# Patient Record
Sex: Male | Born: 1945 | Race: White | Hispanic: No | Marital: Married | Smoking: Never smoker
Health system: Southern US, Community
[De-identification: ages and names within clinical notes are randomized; demographics above are authoritative.]

## PROBLEM LIST (undated history)

## (undated) DIAGNOSIS — E7800 Pure hypercholesterolemia, unspecified: Secondary | ICD-10-CM

## (undated) DIAGNOSIS — N400 Enlarged prostate without lower urinary tract symptoms: Secondary | ICD-10-CM

## (undated) DIAGNOSIS — I10 Essential (primary) hypertension: Secondary | ICD-10-CM

## (undated) DIAGNOSIS — C61 Malignant neoplasm of prostate: Secondary | ICD-10-CM

## (undated) HISTORY — DX: Essential (primary) hypertension: I10

## (undated) HISTORY — DX: Benign prostatic hyperplasia without lower urinary tract symptoms: N40.0

## (undated) HISTORY — DX: Pure hypercholesterolemia, unspecified: E78.00

## (undated) HISTORY — PX: TONSILLECTOMY: SUR1361

---

## 2009-08-13 ENCOUNTER — Ambulatory Visit: Payer: Self-pay | Admitting: Radiology

## 2009-08-13 ENCOUNTER — Emergency Department (HOSPITAL_BASED_OUTPATIENT_CLINIC_OR_DEPARTMENT_OTHER): Admission: EM | Admit: 2009-08-13 | Discharge: 2009-08-13 | Payer: Self-pay | Admitting: Emergency Medicine

## 2011-06-10 DIAGNOSIS — H31009 Unspecified chorioretinal scars, unspecified eye: Secondary | ICD-10-CM | POA: Diagnosis not present

## 2012-03-18 DIAGNOSIS — Z1331 Encounter for screening for depression: Secondary | ICD-10-CM | POA: Diagnosis not present

## 2012-03-18 DIAGNOSIS — L723 Sebaceous cyst: Secondary | ICD-10-CM | POA: Diagnosis not present

## 2012-04-02 DIAGNOSIS — I10 Essential (primary) hypertension: Secondary | ICD-10-CM | POA: Diagnosis not present

## 2012-04-02 DIAGNOSIS — Z0000 Encounter for general adult medical examination without abnormal findings: Secondary | ICD-10-CM | POA: Diagnosis not present

## 2012-04-02 DIAGNOSIS — E7800 Pure hypercholesterolemia, unspecified: Secondary | ICD-10-CM | POA: Diagnosis not present

## 2012-06-24 DIAGNOSIS — M19049 Primary osteoarthritis, unspecified hand: Secondary | ICD-10-CM | POA: Diagnosis not present

## 2012-06-30 DIAGNOSIS — D2110 Benign neoplasm of connective and other soft tissue of unspecified upper limb, including shoulder: Secondary | ICD-10-CM | POA: Diagnosis not present

## 2012-06-30 DIAGNOSIS — D481 Neoplasm of uncertain behavior of connective and other soft tissue: Secondary | ICD-10-CM | POA: Diagnosis not present

## 2012-06-30 DIAGNOSIS — D1610 Benign neoplasm of short bones of unspecified upper limb: Secondary | ICD-10-CM | POA: Diagnosis not present

## 2013-01-28 DIAGNOSIS — H31099 Other chorioretinal scars, unspecified eye: Secondary | ICD-10-CM | POA: Diagnosis not present

## 2013-01-28 DIAGNOSIS — H43819 Vitreous degeneration, unspecified eye: Secondary | ICD-10-CM | POA: Diagnosis not present

## 2013-01-28 DIAGNOSIS — Z9889 Other specified postprocedural states: Secondary | ICD-10-CM | POA: Diagnosis not present

## 2013-02-16 DIAGNOSIS — Z23 Encounter for immunization: Secondary | ICD-10-CM | POA: Diagnosis not present

## 2013-04-22 DIAGNOSIS — R7309 Other abnormal glucose: Secondary | ICD-10-CM | POA: Diagnosis not present

## 2013-04-22 DIAGNOSIS — Z0000 Encounter for general adult medical examination without abnormal findings: Secondary | ICD-10-CM | POA: Diagnosis not present

## 2013-04-22 DIAGNOSIS — E7800 Pure hypercholesterolemia, unspecified: Secondary | ICD-10-CM | POA: Diagnosis not present

## 2013-04-22 DIAGNOSIS — I10 Essential (primary) hypertension: Secondary | ICD-10-CM | POA: Diagnosis not present

## 2013-04-22 DIAGNOSIS — Z1211 Encounter for screening for malignant neoplasm of colon: Secondary | ICD-10-CM | POA: Diagnosis not present

## 2013-07-04 DIAGNOSIS — Z1211 Encounter for screening for malignant neoplasm of colon: Secondary | ICD-10-CM | POA: Diagnosis not present

## 2013-07-18 ENCOUNTER — Emergency Department (HOSPITAL_BASED_OUTPATIENT_CLINIC_OR_DEPARTMENT_OTHER)
Admission: EM | Admit: 2013-07-18 | Discharge: 2013-07-18 | Disposition: A | Discharge status: Home / Self Care | Payer: Medicare Other | Attending: Emergency Medicine | Admitting: Emergency Medicine

## 2013-07-18 ENCOUNTER — Encounter (HOSPITAL_BASED_OUTPATIENT_CLINIC_OR_DEPARTMENT_OTHER): Payer: Self-pay | Admitting: Emergency Medicine

## 2013-07-18 ENCOUNTER — Emergency Department (HOSPITAL_BASED_OUTPATIENT_CLINIC_OR_DEPARTMENT_OTHER): Payer: Medicare Other

## 2013-07-18 DIAGNOSIS — Y9389 Activity, other specified: Secondary | ICD-10-CM | POA: Insufficient documentation

## 2013-07-18 DIAGNOSIS — Z23 Encounter for immunization: Secondary | ICD-10-CM | POA: Diagnosis not present

## 2013-07-18 DIAGNOSIS — S6990XA Unspecified injury of unspecified wrist, hand and finger(s), initial encounter: Secondary | ICD-10-CM | POA: Diagnosis not present

## 2013-07-18 DIAGNOSIS — Y929 Unspecified place or not applicable: Secondary | ICD-10-CM | POA: Insufficient documentation

## 2013-07-18 DIAGNOSIS — M79609 Pain in unspecified limb: Secondary | ICD-10-CM | POA: Diagnosis not present

## 2013-07-18 DIAGNOSIS — S6980XA Other specified injuries of unspecified wrist, hand and finger(s), initial encounter: Secondary | ICD-10-CM | POA: Diagnosis not present

## 2013-07-18 DIAGNOSIS — S61209A Unspecified open wound of unspecified finger without damage to nail, initial encounter: Secondary | ICD-10-CM | POA: Diagnosis not present

## 2013-07-18 DIAGNOSIS — W278XXA Contact with other nonpowered hand tool, initial encounter: Secondary | ICD-10-CM | POA: Insufficient documentation

## 2013-07-18 DIAGNOSIS — S61311A Laceration without foreign body of left index finger with damage to nail, initial encounter: Secondary | ICD-10-CM

## 2013-07-18 MED ORDER — CEPHALEXIN 500 MG PO CAPS
500.0000 mg | ORAL_CAPSULE | Freq: Four times a day (QID) | ORAL | Status: DC
Start: 2013-07-18 — End: 2018-08-13

## 2013-07-18 MED ORDER — TETANUS-DIPHTH-ACELL PERTUSSIS 5-2.5-18.5 LF-MCG/0.5 IM SUSP
0.5000 mL | Freq: Once | INTRAMUSCULAR | Status: AC
  Administered 2013-07-18: 0.5 mL via INTRAMUSCULAR
  Filled 2013-07-18: qty 0.5

## 2013-07-18 MED ORDER — HYDROCODONE-ACETAMINOPHEN 5-325 MG PO TABS: 2.0000 | ORAL_TABLET | ORAL | Status: DC | PRN

## 2013-07-18 NOTE — ED Notes (Signed)
Pt reports that he got his finger caught in a table saw.  Noted to have multiple cuts on the tip of his (L) pointer finger.  Noted to be bleeding at this time.  Pressure dressing applied.

## 2013-07-18 NOTE — Discharge Instructions (Signed)
Keflex as prescribed.  Keep wound clean and dry. Change the dressing twice daily and apply bacitracin.  Return to the emergency department if you develop pus draining from the wound, streaks up the arm, or other new or concerning symptoms.   Fingertip Injuries and Amputations Fingertip injuries are common and often get injured because they are last to escape when pulling your hand out of harm's way. You have amputated (cut off) part of your finger. How this turns out depends largely on how much was amputated. If just the tip is amputated, often the end of the finger will grow back and the finger may return to much the same as it was before the injury.  If more of the finger is missing, your caregiver has done the best with the tissue remaining to allow you to keep as much finger as is possible. Your caregiver after checking your injury has tried to leave you with a painless fingertip that has durable, feeling skin. If possible, your caregiver has tried to maintain the finger's length and appearance and preserve its fingernail.  Please read the instructions outlined below and refer to this sheet in the next few weeks. These instructions provide you with general information on caring for yourself. Your caregiver may also give you specific instructions. While your treatment has been done according to the most current medical practices available, unavoidable complications occasionally occur. If you have any problems or questions after discharge, please call your caregiver. HOME CARE INSTRUCTIONS   You may resume normal diet and activities as directed or allowed.  Keep your hand elevated above the level of your heart. This helps decrease pain and swelling.  Keep ice packs (or a bag of ice wrapped in a towel) on the injured area for 15-20 minutes, 03-04 times per day, for the first two days.  Change dressings if necessary or as directed.  Clean the wound daily or as directed.  Only take  over-the-counter or prescription medicines for pain, discomfort, or fever as directed by your caregiver.  Keep appointments as directed. SEEK IMMEDIATE MEDICAL CARE IF:  You develop redness, swelling, numbness or increasing pain in the wound.  There is pus coming from the wound.  You develop an unexplained oral temperature above 102 F (38.9 C) or as your caregiver suggests.  There is a foul (bad) smell coming from the wound or dressing.  There is a breaking open of the wound (edges not staying together) after sutures or staples have been removed. MAKE SURE YOU:   Understand these instructions.  Will watch your condition.  Will get help right away if you are not doing well or get worse. Document Released: 04/09/2005 Document Revised: 08/11/2011 Document Reviewed: 03/08/2008 [facility] Patient Information 2014 [place], [place].

## 2013-07-18 NOTE — ED Provider Notes (Signed)
CSN: [id number]     Arrival date & time 07/18/13  1301 History   First MD Initiated Contact with Patient 07/18/13 1342     Chief Complaint  Patient presents with  . Finger Injury     (Consider location/radiation/quality/duration/timing/severity/associated sxs/prior Treatment) HPI Comments: Patient was operating a table saw when he caught his left finger tip in the blade causing a laceration. His last tetanus shot was 6 years ago.  Patient is a 68 y.o. male presenting with hand pain. The history is provided by the patient.  Hand Pain This is a new problem. The current episode started 1 to 2 hours ago. The problem occurs constantly. The problem has not changed since onset.Nothing aggravates the symptoms. Nothing relieves the symptoms. He has tried nothing for the symptoms. The treatment provided no relief.    History reviewed. No pertinent past medical history. History reviewed. No pertinent past surgical history. History reviewed. No pertinent family history. History  Substance Use Topics  . Smoking status: Never Smoker   . Smokeless tobacco: Not on file  . Alcohol Use: No    Review of Systems  All other systems reviewed and are negative.      Allergies  Review of patient's allergies indicates no known allergies.  Home Medications  No current outpatient prescriptions on file. BP 138/77  Pulse 93  Temp(Src) 98.9 F (37.2 C) (Oral)  Resp 18  Ht 6' (1.829 m)  Wt 212 lb (96.163 kg)  BMI 28.75 kg/m2  SpO2 98% Physical Exam  Nursing note and vitals reviewed. Constitutional: He is oriented to person, place, and time. He appears well-developed and well-nourished. No distress.  HENT:  Head: Normocephalic and atraumatic.  Neck: Neck supple.  Musculoskeletal:  The left index finger is noted to have a laceration to the tip extending approximately 1.5 cm. There is section of the nail which has been avulsed.  Neurological: He is alert and oriented to person, place, and time.   Skin: Skin is warm and dry. He is not diaphoretic.    ED Course  Procedures (including critical care time) Labs Review Labs Reviewed - No data to display Imaging Review Dg Finger Index Left  07/18/2013   CLINICAL DATA:  Traumatic injury with pain  EXAM: LEFT INDEX FINGER 2+V  COMPARISON:  None.  FINDINGS: Degenerative changes are noted in the interphalangeal joints. Very mild lucency is noted in the phalangeal tuft although no separated fracture is seen. Given the patient's clinical history this may represent a partial injury without complete separation of the bony fragment. Overlying soft tissue injury is noted.  IMPRESSION: Likely mild injury to the distal phalangeal tuft duct without definitive fracture.   Electronically Signed   By: Inez Catalina M.D.   On: 07/18/2013 13:54   LACERATION REPAIR Performed by: Veryl Speak Authorized by: Veryl Speak Consent: Verbal consent obtained. Risks and benefits: risks, benefits and alternatives were discussed Consent given by: patient Patient identity confirmed: provided demographic data Prepped and Draped in normal sterile fashion Wound explored  Laceration Location: Left index finger  Laceration Length: 1.5 cm cm  No Foreign Bodies seen or palpated  Anesthesia: local infiltration  Local anesthetic: lidocaine 2 % without epinephrine  Anesthetic total: 1 ml  Irrigation method: syringe Amount of cleaning: standard  Skin closure: 5-0 Ethilon   Number of sutures: 4   Technique: Simple interrupted   Patient tolerance: Patient tolerated the procedure well with no immediate complications.    MDM   Final diagnoses:  None  x-rays do not reveal a definite fracture. The laceration was repaired the patient tolerated this well. He will be given a tetanus shot and will be treated with antibiotics. He is to return as needed for any problems.    Veryl Speak, MD 07/18/13 1420

## 2013-07-27 DIAGNOSIS — Z4802 Encounter for removal of sutures: Secondary | ICD-10-CM | POA: Diagnosis not present

## 2013-07-27 DIAGNOSIS — S61209A Unspecified open wound of unspecified finger without damage to nail, initial encounter: Secondary | ICD-10-CM | POA: Diagnosis not present

## 2014-02-14 DIAGNOSIS — Z23 Encounter for immunization: Secondary | ICD-10-CM | POA: Diagnosis not present

## 2014-04-26 DIAGNOSIS — I10 Essential (primary) hypertension: Secondary | ICD-10-CM | POA: Diagnosis not present

## 2014-04-26 DIAGNOSIS — Z125 Encounter for screening for malignant neoplasm of prostate: Secondary | ICD-10-CM | POA: Diagnosis not present

## 2014-04-26 DIAGNOSIS — Z0000 Encounter for general adult medical examination without abnormal findings: Secondary | ICD-10-CM | POA: Diagnosis not present

## 2014-04-26 DIAGNOSIS — R7309 Other abnormal glucose: Secondary | ICD-10-CM | POA: Diagnosis not present

## 2014-04-26 DIAGNOSIS — E780 Pure hypercholesterolemia: Secondary | ICD-10-CM | POA: Diagnosis not present

## 2014-04-26 DIAGNOSIS — Z23 Encounter for immunization: Secondary | ICD-10-CM | POA: Diagnosis not present

## 2014-05-02 DIAGNOSIS — Z1211 Encounter for screening for malignant neoplasm of colon: Secondary | ICD-10-CM | POA: Diagnosis not present

## 2014-06-19 DIAGNOSIS — R312 Other microscopic hematuria: Secondary | ICD-10-CM | POA: Diagnosis not present

## 2014-06-19 DIAGNOSIS — R972 Elevated prostate specific antigen [PSA]: Secondary | ICD-10-CM | POA: Diagnosis not present

## 2014-08-17 DIAGNOSIS — R972 Elevated prostate specific antigen [PSA]: Secondary | ICD-10-CM | POA: Diagnosis not present

## 2014-09-04 DIAGNOSIS — R972 Elevated prostate specific antigen [PSA]: Secondary | ICD-10-CM | POA: Diagnosis not present

## 2014-09-04 DIAGNOSIS — R312 Other microscopic hematuria: Secondary | ICD-10-CM | POA: Diagnosis not present

## 2014-10-31 DIAGNOSIS — K8020 Calculus of gallbladder without cholecystitis without obstruction: Secondary | ICD-10-CM | POA: Diagnosis not present

## 2014-10-31 DIAGNOSIS — N200 Calculus of kidney: Secondary | ICD-10-CM | POA: Diagnosis not present

## 2014-10-31 DIAGNOSIS — R312 Other microscopic hematuria: Secondary | ICD-10-CM | POA: Diagnosis not present

## 2014-10-31 DIAGNOSIS — K5730 Diverticulosis of large intestine without perforation or abscess without bleeding: Secondary | ICD-10-CM | POA: Diagnosis not present

## 2014-11-07 DIAGNOSIS — N400 Enlarged prostate without lower urinary tract symptoms: Secondary | ICD-10-CM | POA: Diagnosis not present

## 2014-11-07 DIAGNOSIS — R972 Elevated prostate specific antigen [PSA]: Secondary | ICD-10-CM | POA: Diagnosis not present

## 2014-11-07 DIAGNOSIS — R312 Other microscopic hematuria: Secondary | ICD-10-CM | POA: Diagnosis not present

## 2014-12-13 DIAGNOSIS — Z9889 Other specified postprocedural states: Secondary | ICD-10-CM | POA: Diagnosis not present

## 2014-12-13 DIAGNOSIS — H43813 Vitreous degeneration, bilateral: Secondary | ICD-10-CM | POA: Diagnosis not present

## 2014-12-13 DIAGNOSIS — H31093 Other chorioretinal scars, bilateral: Secondary | ICD-10-CM | POA: Diagnosis not present

## 2014-12-13 DIAGNOSIS — H43312 Vitreous membranes and strands, left eye: Secondary | ICD-10-CM | POA: Diagnosis not present

## 2015-02-01 IMAGING — CR DG FINGER INDEX 2+V*L*
3 series · 3 of 3 positions shown · non-contrast
Comparison: None.

CLINICAL DATA: Traumatic injury with pain

EXAM:
LEFT INDEX FINGER 2+V

[x finger pa left]
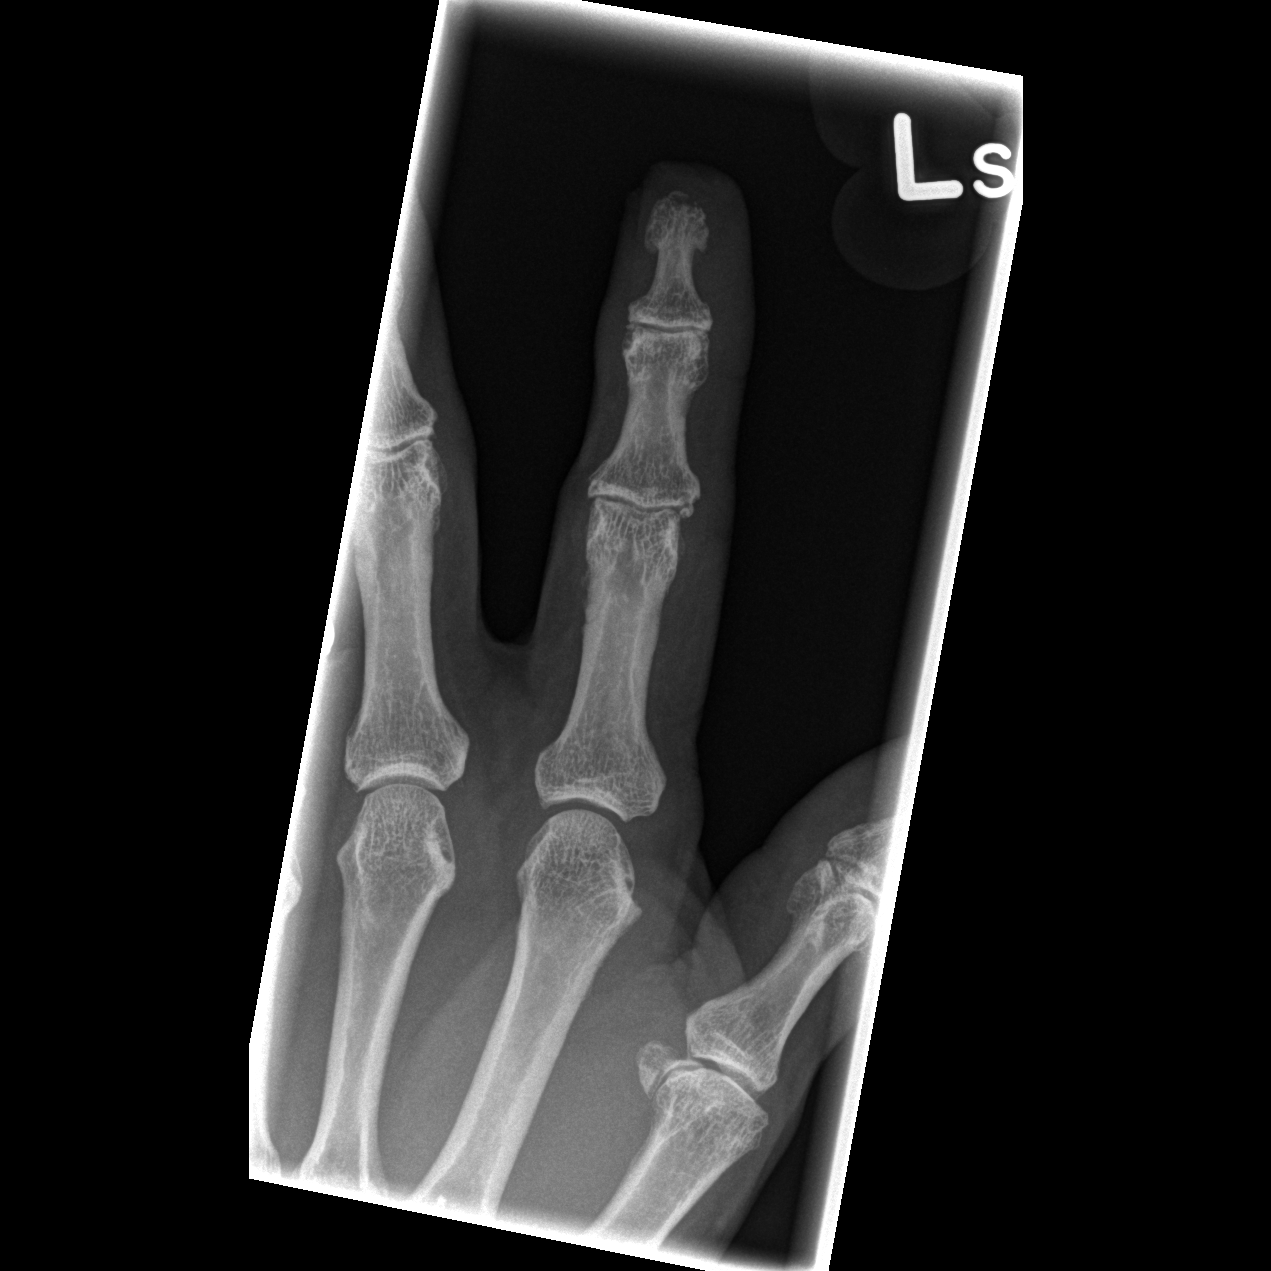

[x finger obl. left]
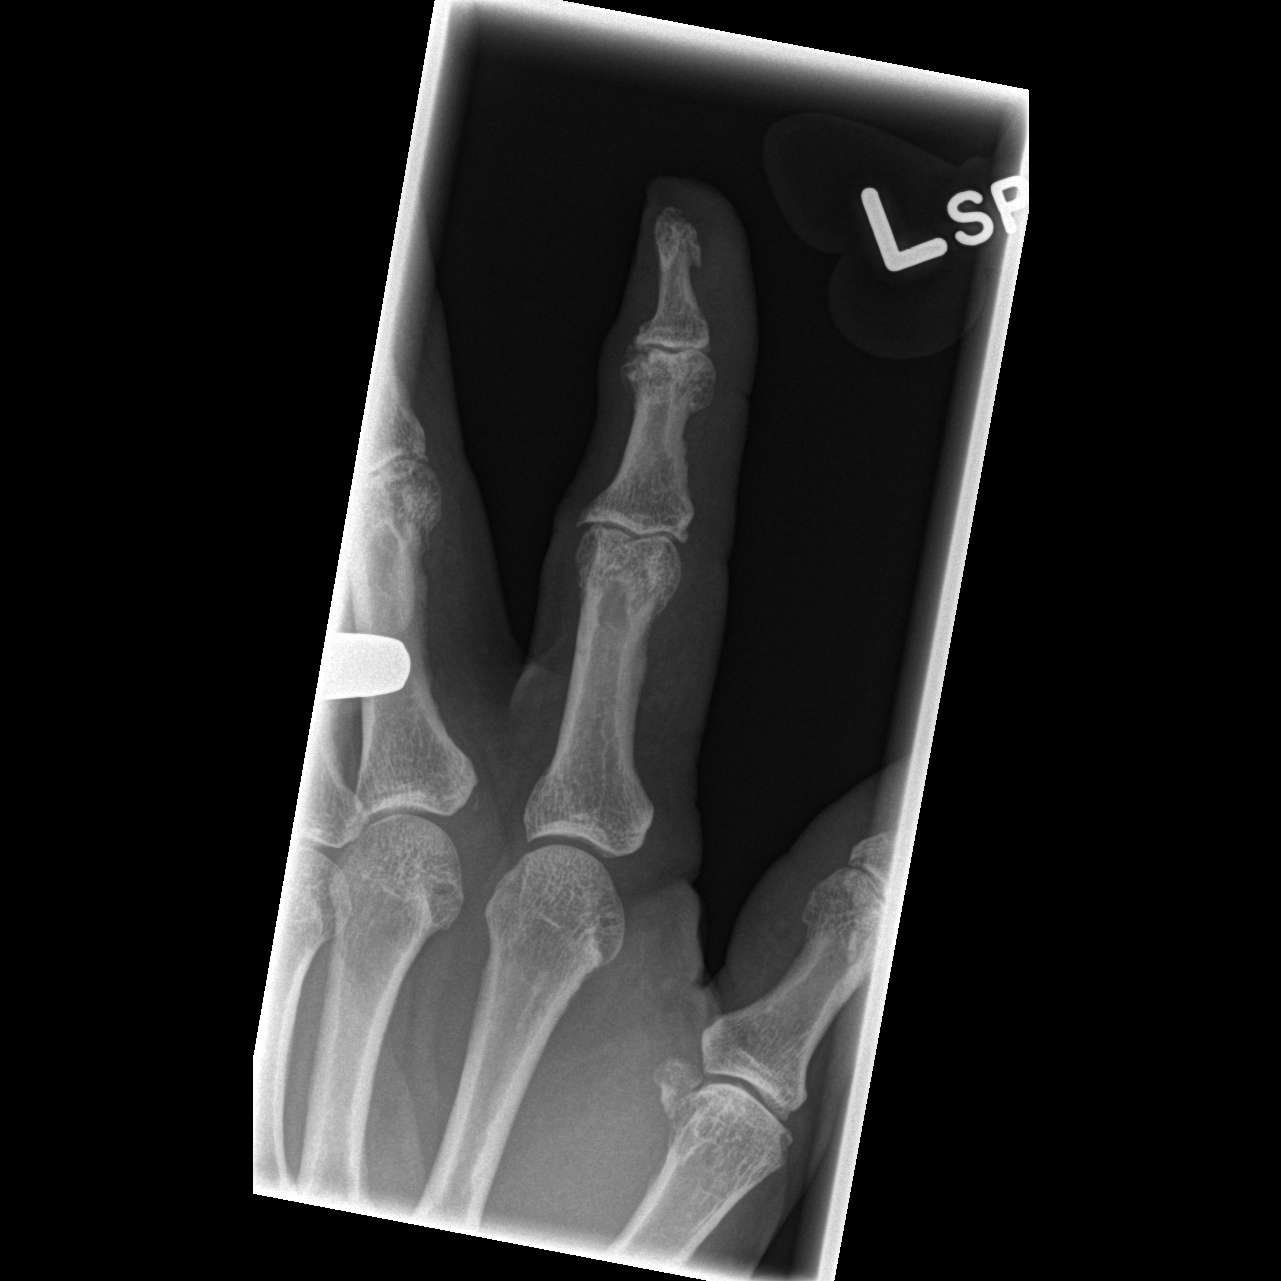

[x finger lateral left]
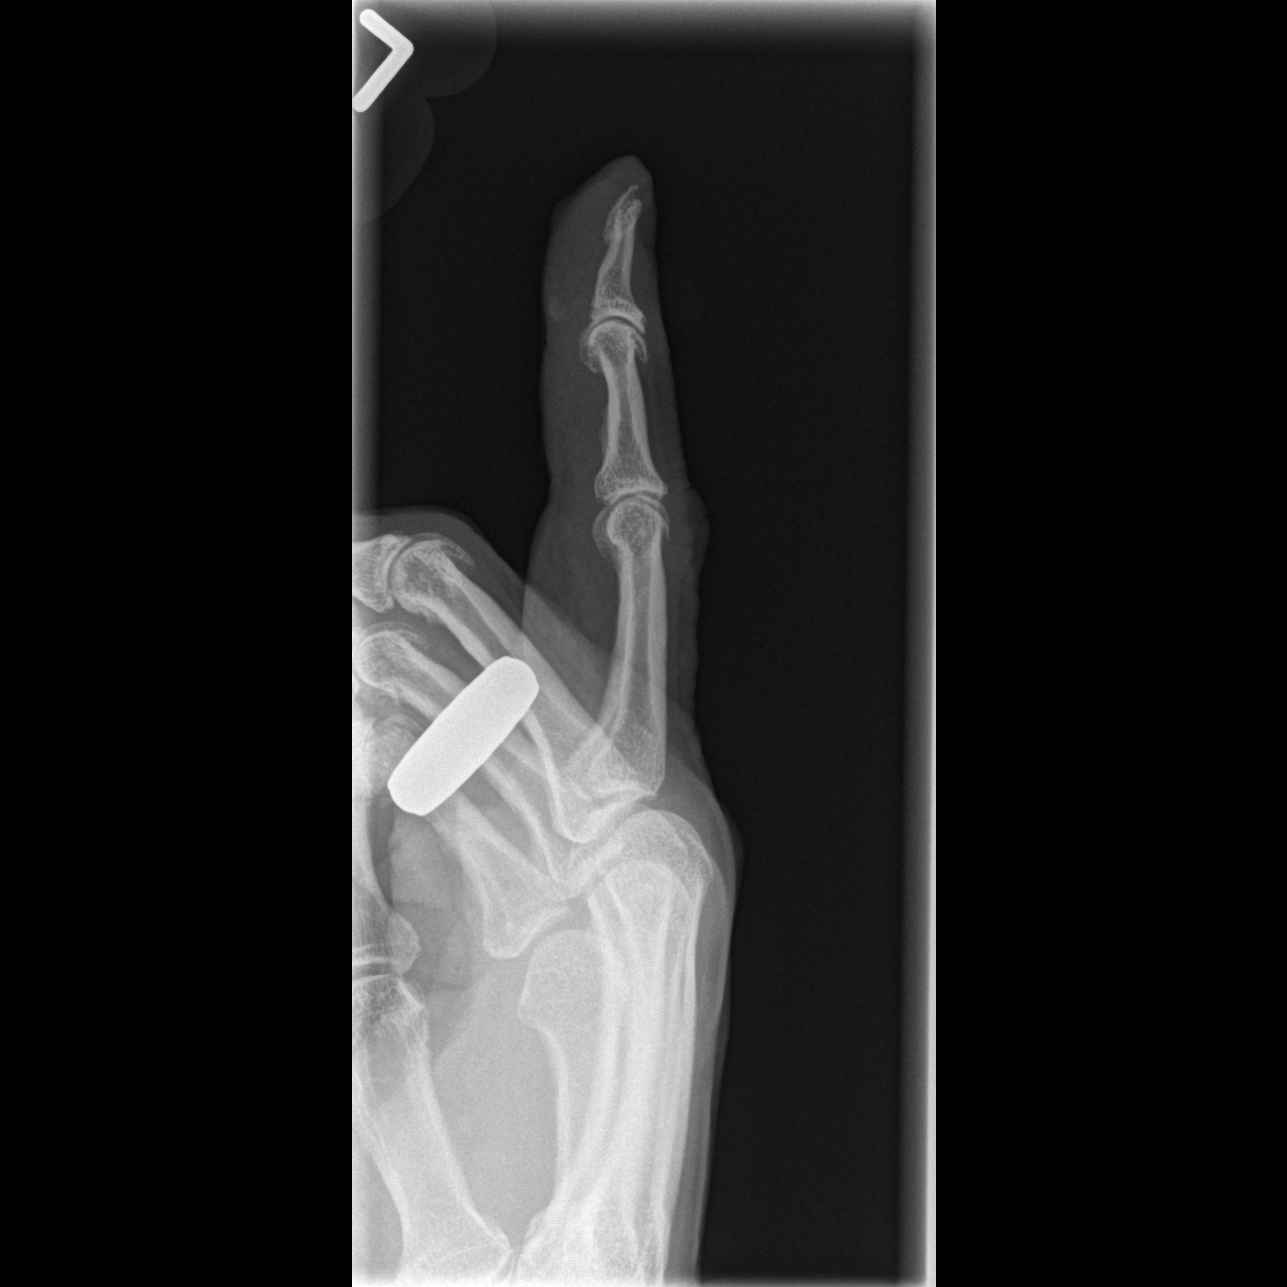

[3 of 3 positions shown; findings below may reference images not displayed]

FINDINGS: Degenerative changes are noted in the interphalangeal joints. Very
mild lucency is noted in the phalangeal tuft although no separated
fracture is seen. Given the patient's clinical history this may
represent a partial injury without complete separation of the bony
fragment. Overlying soft tissue injury is noted.
IMPRESSION: Likely mild injury to the distal phalangeal tuft duct without
definitive fracture.

## 2015-03-06 DIAGNOSIS — Z23 Encounter for immunization: Secondary | ICD-10-CM | POA: Diagnosis not present

## 2015-05-23 DIAGNOSIS — M109 Gout, unspecified: Secondary | ICD-10-CM | POA: Diagnosis not present

## 2015-06-06 DIAGNOSIS — Z0000 Encounter for general adult medical examination without abnormal findings: Secondary | ICD-10-CM | POA: Diagnosis not present

## 2015-06-06 DIAGNOSIS — R972 Elevated prostate specific antigen [PSA]: Secondary | ICD-10-CM | POA: Diagnosis not present

## 2015-06-06 DIAGNOSIS — E7800 Pure hypercholesterolemia, unspecified: Secondary | ICD-10-CM | POA: Diagnosis not present

## 2015-06-06 DIAGNOSIS — I10 Essential (primary) hypertension: Secondary | ICD-10-CM | POA: Diagnosis not present

## 2015-06-06 DIAGNOSIS — R7309 Other abnormal glucose: Secondary | ICD-10-CM | POA: Diagnosis not present

## 2015-06-06 DIAGNOSIS — M25531 Pain in right wrist: Secondary | ICD-10-CM | POA: Diagnosis not present

## 2015-06-06 DIAGNOSIS — Z1389 Encounter for screening for other disorder: Secondary | ICD-10-CM | POA: Diagnosis not present

## 2015-06-29 DIAGNOSIS — R7301 Impaired fasting glucose: Secondary | ICD-10-CM | POA: Diagnosis not present

## 2015-09-12 DIAGNOSIS — E7800 Pure hypercholesterolemia, unspecified: Secondary | ICD-10-CM | POA: Diagnosis not present

## 2015-09-12 DIAGNOSIS — I10 Essential (primary) hypertension: Secondary | ICD-10-CM | POA: Diagnosis not present

## 2015-11-01 DIAGNOSIS — N400 Enlarged prostate without lower urinary tract symptoms: Secondary | ICD-10-CM | POA: Diagnosis not present

## 2015-11-08 DIAGNOSIS — R972 Elevated prostate specific antigen [PSA]: Secondary | ICD-10-CM | POA: Diagnosis not present

## 2015-11-08 DIAGNOSIS — R3121 Asymptomatic microscopic hematuria: Secondary | ICD-10-CM | POA: Diagnosis not present

## 2015-12-19 DIAGNOSIS — Z1211 Encounter for screening for malignant neoplasm of colon: Secondary | ICD-10-CM | POA: Diagnosis not present

## 2015-12-25 DIAGNOSIS — H35373 Puckering of macula, bilateral: Secondary | ICD-10-CM | POA: Diagnosis not present

## 2015-12-25 DIAGNOSIS — H43813 Vitreous degeneration, bilateral: Secondary | ICD-10-CM | POA: Diagnosis not present

## 2015-12-25 DIAGNOSIS — Z9889 Other specified postprocedural states: Secondary | ICD-10-CM | POA: Diagnosis not present

## 2015-12-25 DIAGNOSIS — H52213 Irregular astigmatism, bilateral: Secondary | ICD-10-CM | POA: Diagnosis not present

## 2016-02-14 DIAGNOSIS — Z23 Encounter for immunization: Secondary | ICD-10-CM | POA: Diagnosis not present

## 2016-06-20 DIAGNOSIS — Z0000 Encounter for general adult medical examination without abnormal findings: Secondary | ICD-10-CM | POA: Diagnosis not present

## 2016-06-20 DIAGNOSIS — I10 Essential (primary) hypertension: Secondary | ICD-10-CM | POA: Diagnosis not present

## 2016-06-20 DIAGNOSIS — Z1211 Encounter for screening for malignant neoplasm of colon: Secondary | ICD-10-CM | POA: Diagnosis not present

## 2016-06-20 DIAGNOSIS — E7800 Pure hypercholesterolemia, unspecified: Secondary | ICD-10-CM | POA: Diagnosis not present

## 2016-07-11 DIAGNOSIS — J101 Influenza due to other identified influenza virus with other respiratory manifestations: Secondary | ICD-10-CM | POA: Diagnosis not present

## 2016-07-11 DIAGNOSIS — R6889 Other general symptoms and signs: Secondary | ICD-10-CM | POA: Diagnosis not present

## 2016-11-14 DIAGNOSIS — M545 Low back pain: Secondary | ICD-10-CM | POA: Diagnosis not present

## 2016-11-19 DIAGNOSIS — R972 Elevated prostate specific antigen [PSA]: Secondary | ICD-10-CM | POA: Diagnosis not present

## 2016-11-24 DIAGNOSIS — R3121 Asymptomatic microscopic hematuria: Secondary | ICD-10-CM | POA: Diagnosis not present

## 2016-11-24 DIAGNOSIS — N400 Enlarged prostate without lower urinary tract symptoms: Secondary | ICD-10-CM | POA: Diagnosis not present

## 2016-11-24 DIAGNOSIS — R972 Elevated prostate specific antigen [PSA]: Secondary | ICD-10-CM | POA: Diagnosis not present

## 2016-11-27 ENCOUNTER — Other Ambulatory Visit: Payer: Self-pay | Admitting: Urology

## 2016-11-27 DIAGNOSIS — R972 Elevated prostate specific antigen [PSA]: Secondary | ICD-10-CM

## 2016-12-01 DIAGNOSIS — M545 Low back pain: Secondary | ICD-10-CM | POA: Diagnosis not present

## 2017-01-06 ENCOUNTER — Ambulatory Visit
Admission: RE | Admit: 2017-01-06 | Discharge: 2017-01-06 | Disposition: A | Discharge status: Home / Self Care | Payer: Medicare Other | Source: Ambulatory Visit | Attending: Urology | Admitting: Urology

## 2017-01-06 DIAGNOSIS — R972 Elevated prostate specific antigen [PSA]: Secondary | ICD-10-CM | POA: Diagnosis not present

## 2017-01-06 MED ORDER — GADOBENATE DIMEGLUMINE 529 MG/ML IV SOLN
20.0000 mL | Freq: Once | INTRAVENOUS | Status: AC | PRN
  Administered 2017-01-06: 20 mL via INTRAVENOUS

## 2017-01-13 DIAGNOSIS — H35371 Puckering of macula, right eye: Secondary | ICD-10-CM | POA: Diagnosis not present

## 2017-01-13 DIAGNOSIS — H40013 Open angle with borderline findings, low risk, bilateral: Secondary | ICD-10-CM | POA: Diagnosis not present

## 2017-01-13 DIAGNOSIS — H47323 Drusen of optic disc, bilateral: Secondary | ICD-10-CM | POA: Diagnosis not present

## 2017-01-13 DIAGNOSIS — H2513 Age-related nuclear cataract, bilateral: Secondary | ICD-10-CM | POA: Diagnosis not present

## 2017-01-13 DIAGNOSIS — H43813 Vitreous degeneration, bilateral: Secondary | ICD-10-CM | POA: Diagnosis not present

## 2017-01-26 DIAGNOSIS — S32018A Other fracture of first lumbar vertebra, initial encounter for closed fracture: Secondary | ICD-10-CM | POA: Diagnosis not present

## 2017-01-26 DIAGNOSIS — N400 Enlarged prostate without lower urinary tract symptoms: Secondary | ICD-10-CM | POA: Diagnosis not present

## 2017-01-26 DIAGNOSIS — R972 Elevated prostate specific antigen [PSA]: Secondary | ICD-10-CM | POA: Diagnosis not present

## 2017-01-26 DIAGNOSIS — R3121 Asymptomatic microscopic hematuria: Secondary | ICD-10-CM | POA: Diagnosis not present

## 2017-03-01 DIAGNOSIS — Z23 Encounter for immunization: Secondary | ICD-10-CM | POA: Diagnosis not present

## 2017-05-06 DIAGNOSIS — M545 Low back pain: Secondary | ICD-10-CM | POA: Diagnosis not present

## 2017-06-24 DIAGNOSIS — Z79899 Other long term (current) drug therapy: Secondary | ICD-10-CM | POA: Diagnosis not present

## 2017-06-24 DIAGNOSIS — M545 Low back pain: Secondary | ICD-10-CM | POA: Diagnosis not present

## 2017-06-24 DIAGNOSIS — Z0000 Encounter for general adult medical examination without abnormal findings: Secondary | ICD-10-CM | POA: Diagnosis not present

## 2017-06-24 DIAGNOSIS — E7800 Pure hypercholesterolemia, unspecified: Secondary | ICD-10-CM | POA: Diagnosis not present

## 2017-06-24 DIAGNOSIS — Z1159 Encounter for screening for other viral diseases: Secondary | ICD-10-CM | POA: Diagnosis not present

## 2017-06-24 DIAGNOSIS — I700 Atherosclerosis of aorta: Secondary | ICD-10-CM | POA: Diagnosis not present

## 2017-06-24 DIAGNOSIS — I10 Essential (primary) hypertension: Secondary | ICD-10-CM | POA: Diagnosis not present

## 2017-07-02 DIAGNOSIS — Z8719 Personal history of other diseases of the digestive system: Secondary | ICD-10-CM | POA: Diagnosis not present

## 2017-07-02 DIAGNOSIS — R1032 Left lower quadrant pain: Secondary | ICD-10-CM | POA: Diagnosis not present

## 2017-07-08 DIAGNOSIS — M545 Low back pain, unspecified: Secondary | ICD-10-CM | POA: Insufficient documentation

## 2017-07-08 DIAGNOSIS — M5136 Other intervertebral disc degeneration, lumbar region: Secondary | ICD-10-CM | POA: Diagnosis not present

## 2017-07-08 DIAGNOSIS — M519 Unspecified thoracic, thoracolumbar and lumbosacral intervertebral disc disorder: Secondary | ICD-10-CM | POA: Diagnosis not present

## 2017-07-13 DIAGNOSIS — R413 Other amnesia: Secondary | ICD-10-CM | POA: Diagnosis not present

## 2017-11-23 DIAGNOSIS — R972 Elevated prostate specific antigen [PSA]: Secondary | ICD-10-CM | POA: Diagnosis not present

## 2017-11-30 DIAGNOSIS — R3121 Asymptomatic microscopic hematuria: Secondary | ICD-10-CM | POA: Diagnosis not present

## 2017-11-30 DIAGNOSIS — N400 Enlarged prostate without lower urinary tract symptoms: Secondary | ICD-10-CM | POA: Diagnosis not present

## 2017-11-30 DIAGNOSIS — R972 Elevated prostate specific antigen [PSA]: Secondary | ICD-10-CM | POA: Diagnosis not present

## 2018-01-14 DIAGNOSIS — R413 Other amnesia: Secondary | ICD-10-CM | POA: Diagnosis not present

## 2018-01-14 DIAGNOSIS — E7800 Pure hypercholesterolemia, unspecified: Secondary | ICD-10-CM | POA: Diagnosis not present

## 2018-01-14 DIAGNOSIS — I10 Essential (primary) hypertension: Secondary | ICD-10-CM | POA: Diagnosis not present

## 2018-02-20 DIAGNOSIS — Z23 Encounter for immunization: Secondary | ICD-10-CM | POA: Diagnosis not present

## 2018-05-12 DIAGNOSIS — K648 Other hemorrhoids: Secondary | ICD-10-CM | POA: Diagnosis not present

## 2018-05-12 DIAGNOSIS — K5730 Diverticulosis of large intestine without perforation or abscess without bleeding: Secondary | ICD-10-CM | POA: Diagnosis not present

## 2018-05-12 DIAGNOSIS — D122 Benign neoplasm of ascending colon: Secondary | ICD-10-CM | POA: Diagnosis not present

## 2018-05-12 DIAGNOSIS — Z1211 Encounter for screening for malignant neoplasm of colon: Secondary | ICD-10-CM | POA: Diagnosis not present

## 2018-05-14 DIAGNOSIS — D122 Benign neoplasm of ascending colon: Secondary | ICD-10-CM | POA: Diagnosis not present

## 2018-06-25 DIAGNOSIS — I10 Essential (primary) hypertension: Secondary | ICD-10-CM | POA: Diagnosis not present

## 2018-06-25 DIAGNOSIS — E7800 Pure hypercholesterolemia, unspecified: Secondary | ICD-10-CM | POA: Diagnosis not present

## 2018-06-25 DIAGNOSIS — Z0000 Encounter for general adult medical examination without abnormal findings: Secondary | ICD-10-CM | POA: Diagnosis not present

## 2018-06-25 DIAGNOSIS — R413 Other amnesia: Secondary | ICD-10-CM | POA: Diagnosis not present

## 2018-07-23 IMAGING — MR MR PROSTATE WO/W CM
56 series · 56 of 56 positions shown · IV contrast (Multihance 20cc)
Comparison: 10/31/2014 abdominopelvic CT.

CLINICAL DATA: Elevated PSA.  No symptoms per patient.

EXAM:
MR PROSTATE WITHOUT AND WITH CONTRAST
TECHNIQUE: Multiplanar multisequence MRI images were obtained of the pelvis
centered about the prostate. Pre and post contrast images were
obtained.
CONTRAST:  20mL MULTIHANCE GADOBENATE DIMEGLUMINE 529 MG/ML IV SOLN
Creatinine was obtained on site at [HOSPITAL] at [HOSPITAL].
Results: Creatinine 0.7 mg/dL.

[Series 3: T1 · axial · 8.0mm · 1.06mm/px · 1 of 28 slices shown (1 of 2)]
[im 1/28]
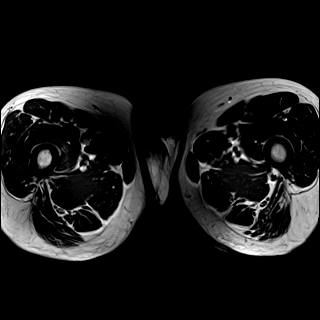

[Series 4: T2 · sagittal · 3.5mm · 0.56mm/px · 1 of 41 slices shown (1 of 4)]
[im 1/41]
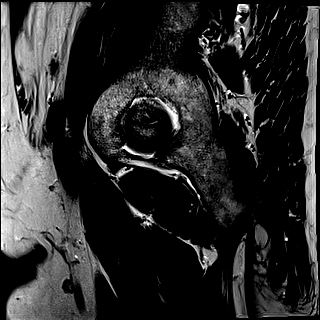

[Series 5: bSSFP fat-sat · axial · 8.0mm · 0.74mm/px · 1 of 28 slices shown]
[im 1/28]
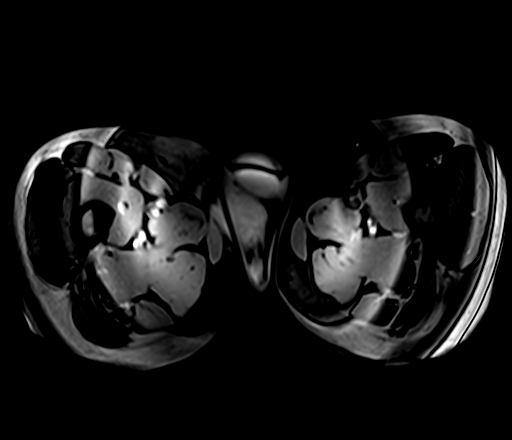

[Series 6: T1 · axial · 3.0mm · 0.31mm/px · 1 of 40 slices shown (2 of 2)]
[im 1/40]
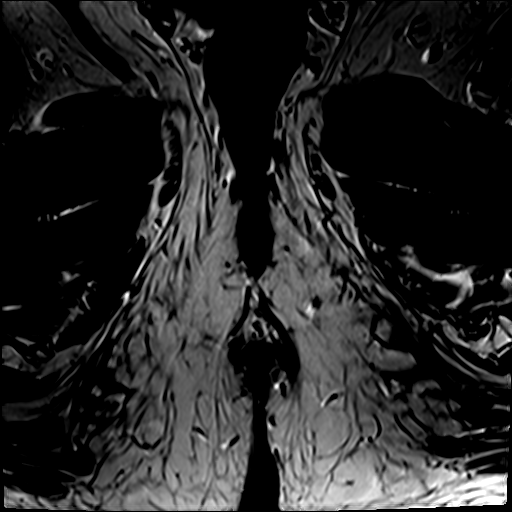

[Series 7: T2 · axial · 3.5mm · 0.56mm/px · 1 of 23 slices shown (2 of 4)]
[im 1/23]
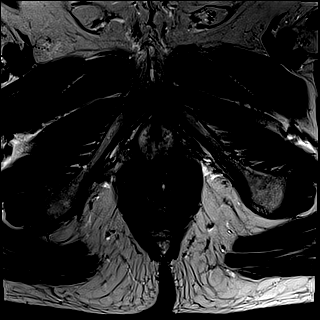

[Series 8: T2 · axial · 1.0mm · 1.04mm/px · 1 of 80 slices shown (3 of 4)]
[im 1/80]
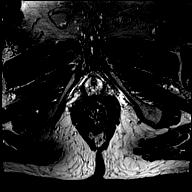

[Series 9: T2 · coronal · 3.5mm · 0.56mm/px · 1 of 34 slices shown (4 of 4)]
[im 1/34]
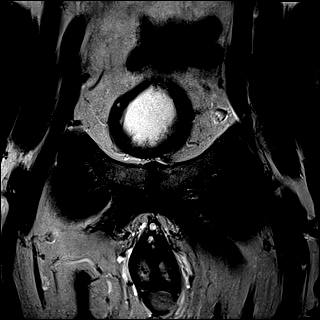

[Series 10: DWI · axial · 3.5mm · 1.56mm/px · 1 of 58 slices shown (1 of 2)]
[im 1/58]
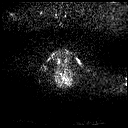

[Series 11: DWI · axial · 3.5mm · 1.56mm/px · 1 of 20 slices shown (2 of 2)]
[im 1/20]
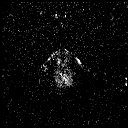

[Series 12: pre t1_twist_tra_dyn_ttc=6.4s · axial · non-contrast · 3.5mm · 0.83mm/px · 1 of 24 slices shown]
[im 1/24]
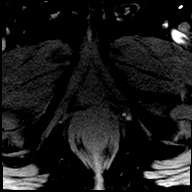

[Series 13: post t1_twist_tra_dyn-copy center · axial · 3.5mm · 0.83mm/px · 1 of 24 slices shown (1 of 24)]
[im 1/24]
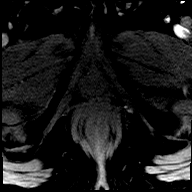

[Series 14: post t1_twist_tra_dyn-copy center · axial · 3.5mm · 0.83mm/px · 1 of 24 slices shown (2 of 24)]
[im 1/24]
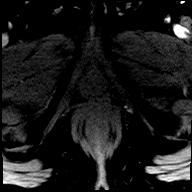

[Series 15: post t1_twist_tra_dyn-copy cent_sub_ttc=28.4s · axial · 3.5mm · 0.83mm/px · 1 of 24 slices shown]
[im 1/24]
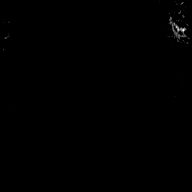

[Series 16: post t1_twist_tra_dyn-copy center · axial · 3.5mm · 0.83mm/px · 1 of 24 slices shown (3 of 24)]
[im 1/24]
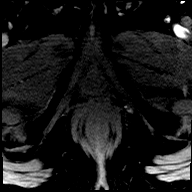

[Series 17: post t1_twist_tra_dyn-copy cent_sub_ttc=41.2s · axial · 3.5mm · 0.83mm/px · 1 of 24 slices shown]
[im 1/24]
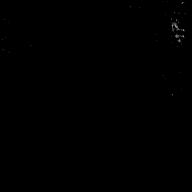

[Series 18: post t1_twist_tra_dyn-copy center · axial · 3.5mm · 0.83mm/px · 1 of 24 slices shown (4 of 24)]
[im 1/24]
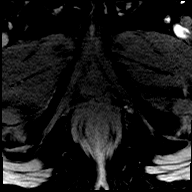

[Series 19: post t1_twist_tra_dyn-copy cent_sub_ttc=54.0s · axial · 3.5mm · 0.83mm/px · 1 of 24 slices shown]
[im 1/24]
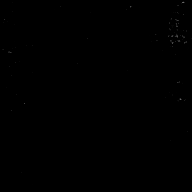

[Series 20: post t1_twist_tra_dyn-copy center · axial · 3.5mm · 0.83mm/px · 1 of 24 slices shown (5 of 24)]
[im 1/24]
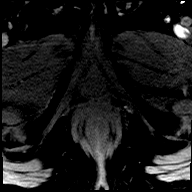

[Series 21: post t1_twist_tra_dyn-copy cent_sub_ttc=66.8s · axial · 3.5mm · 0.83mm/px · 1 of 24 slices shown]
[im 1/24]
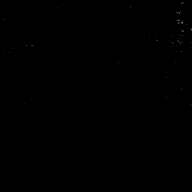

[Series 22: post t1_twist_tra_dyn-copy center · axial · 3.5mm · 0.83mm/px · 1 of 24 slices shown (6 of 24)]
[im 1/24]
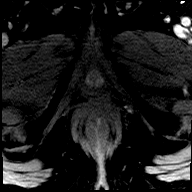

[Series 23: post t1_twist_tra_dyn-copy cent_sub_ttc=79.6s · axial · 3.5mm · 0.83mm/px · 1 of 24 slices shown]
[im 1/24]
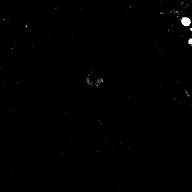

[Series 24: post t1_twist_tra_dyn-copy center · axial · 3.5mm · 0.83mm/px · 1 of 24 slices shown (7 of 24)]
[im 1/24]
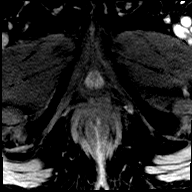

[Series 25: post t1_twist_tra_dyn-copy cent_sub_ttc=92.4s · axial · 3.5mm · 0.83mm/px · 1 of 24 slices shown]
[im 1/24]
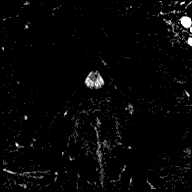

[Series 26: post t1_twist_tra_dyn-copy center · axial · 3.5mm · 0.83mm/px · 1 of 24 slices shown (8 of 24)]
[im 1/24]
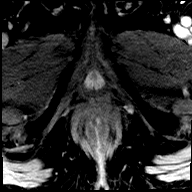

[Series 27: post t1_twist_tra_dyn-copy cent_sub_ttc=105.2s · axial · 3.5mm · 0.83mm/px · 1 of 23 slices shown]
[im 1/23]
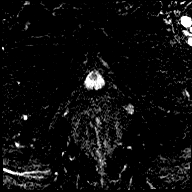

[Series 28: post t1_twist_tra_dyn-copy center · axial · 3.5mm · 0.83mm/px · 1 of 24 slices shown (9 of 24)]
[im 1/24]
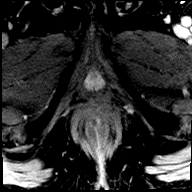

[Series 29: post t1_twist_tra_dyn-copy cent_sub_ttc=117.9s · axial · 3.5mm · 0.83mm/px · 1 of 24 slices shown]
[im 1/24]
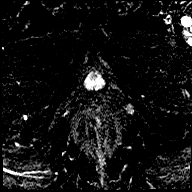

[Series 30: post t1_twist_tra_dyn-copy center · axial · 3.5mm · 0.83mm/px · 1 of 24 slices shown (10 of 24)]
[im 1/24]
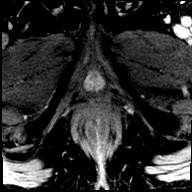

[Series 31: post t1_twist_tra_dyn-copy cent_sub_ttc=130.7s · axial · 3.5mm · 0.83mm/px · 1 of 24 slices shown]
[im 1/24]
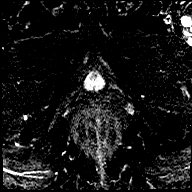

[Series 32: post t1_twist_tra_dyn-copy center · axial · 3.5mm · 0.83mm/px · 1 of 24 slices shown (11 of 24)]
[im 1/24]
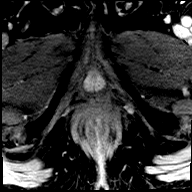

[Series 33: post t1_twist_tra_dyn-copy cent_sub_ttc=143.5s · axial · 3.5mm · 0.83mm/px · 1 of 24 slices shown]
[im 1/24]
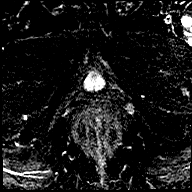

[Series 34: post t1_twist_tra_dyn-copy center · axial · 3.5mm · 0.83mm/px · 1 of 24 slices shown (12 of 24)]
[im 1/24]
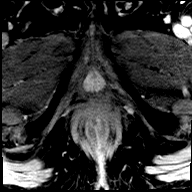

[Series 35: post t1_twist_tra_dyn-copy cent_sub_ttc=156.3s · axial · 3.5mm · 0.83mm/px · 1 of 24 slices shown]
[im 1/24]
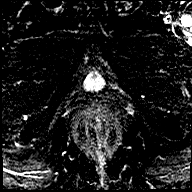

[Series 36: post t1_twist_tra_dyn-copy center · axial · 3.5mm · 0.83mm/px · 1 of 24 slices shown (13 of 24)]
[im 1/24]
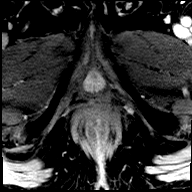

[Series 37: post t1_twist_tra_dyn-copy cent_sub_ttc=169.1s · axial · 3.5mm · 0.83mm/px · 1 of 24 slices shown]
[im 1/24]
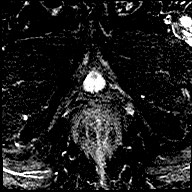

[Series 38: post t1_twist_tra_dyn-copy center · axial · 3.5mm · 0.83mm/px · 1 of 24 slices shown (14 of 24)]
[im 1/24]
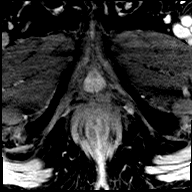

[Series 39: post t1_twist_tra_dyn-copy cent_sub_ttc=181.9s · axial · 3.5mm · 0.83mm/px · 1 of 24 slices shown]
[im 1/24]
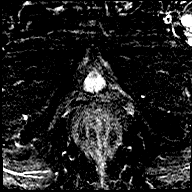

[Series 40: post t1_twist_tra_dyn-copy center · axial · 3.5mm · 0.83mm/px · 1 of 24 slices shown (15 of 24)]
[im 1/24]
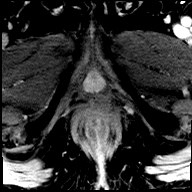

[Series 41: post t1_twist_tra_dyn-copy cent_sub_ttc=194.7s · axial · 3.5mm · 0.83mm/px · 1 of 24 slices shown]
[im 1/24]
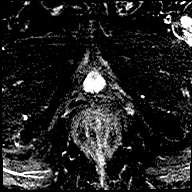

[Series 42: post t1_twist_tra_dyn-copy center · axial · 3.5mm · 0.83mm/px · 1 of 24 slices shown (16 of 24)]
[im 1/24]
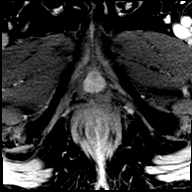

[Series 43: post t1_twist_tra_dyn-copy cent_sub_ttc=207.5s · axial · 3.5mm · 0.83mm/px · 1 of 24 slices shown]
[im 1/24]
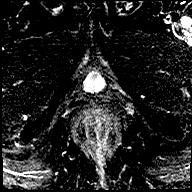

[Series 44: post t1_twist_tra_dyn-copy center · axial · 3.5mm · 0.83mm/px · 1 of 24 slices shown (17 of 24)]
[im 1/24]
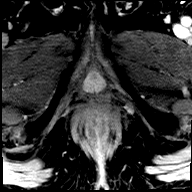

[Series 45: post t1_twist_tra_dyn-copy cent_sub_ttc=220.3s · axial · 3.5mm · 0.83mm/px · 1 of 24 slices shown]
[im 1/24]
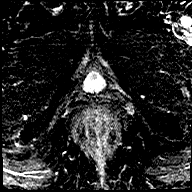

[Series 46: post t1_twist_tra_dyn-copy center · axial · 3.5mm · 0.83mm/px · 1 of 24 slices shown (18 of 24)]
[im 1/24]
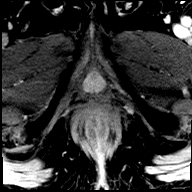

[Series 47: post t1_twist_tra_dyn-copy cent_sub_ttc=233.1s · axial · 3.5mm · 0.83mm/px · 1 of 24 slices shown]
[im 1/24]
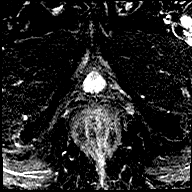

[Series 48: post t1_twist_tra_dyn-copy center · axial · 3.5mm · 0.83mm/px · 1 of 24 slices shown (19 of 24)]
[im 1/24]
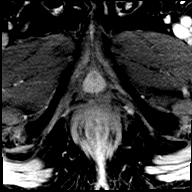

[Series 49: post t1_twist_tra_dyn-copy cent_sub_ttc=245.9s · axial · 3.5mm · 0.83mm/px · 1 of 24 slices shown]
[im 1/24]
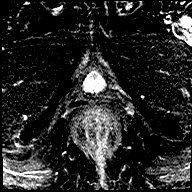

[Series 50: post t1_twist_tra_dyn-copy center · axial · 3.5mm · 0.83mm/px · 1 of 24 slices shown (20 of 24)]
[im 1/24]
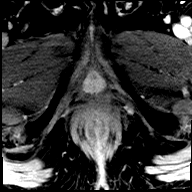

[Series 51: post t1_twist_tra_dyn-copy cent_sub_ttc=258.6s · axial · 3.5mm · 0.83mm/px · 1 of 24 slices shown]
[im 1/24]
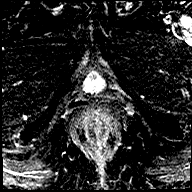

[Series 52: post t1_twist_tra_dyn-copy center · axial · 3.5mm · 0.83mm/px · 1 of 24 slices shown (21 of 24)]
[im 1/24]
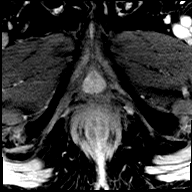

[Series 53: post t1_twist_tra_dyn-copy cent_sub_ttc=271.4s · axial · 3.5mm · 0.83mm/px · 1 of 24 slices shown]
[im 1/24]
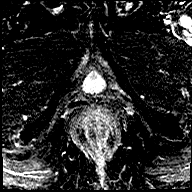

[Series 54: post t1_twist_tra_dyn-copy center · axial · 3.5mm · 0.83mm/px · 1 of 24 slices shown (22 of 24)]
[im 1/24]
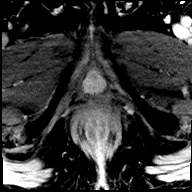

[Series 55: post t1_twist_tra_dyn-copy cent_sub_ttc=284.2s · axial · 3.5mm · 0.83mm/px · 1 of 24 slices shown]
[im 1/24]
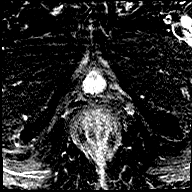

[Series 56: post t1_twist_tra_dyn-copy center · axial · 3.5mm · 0.83mm/px · 1 of 24 slices shown (23 of 24)]
[im 1/24]
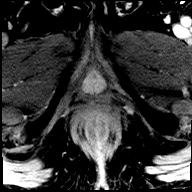

[Series 57: post t1_twist_tra_dyn-copy cent_sub_ttc=297.0s · axial · 3.5mm · 0.83mm/px · 1 of 24 slices shown]
[im 1/24]
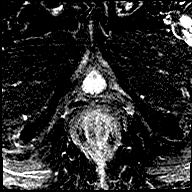

[Series 58: post t1_twist_tra_dyn-copy center · axial · 3.5mm · 0.83mm/px · 1 of 24 slices shown (24 of 24)]
[im 1/24]
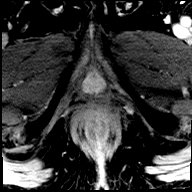

[56 of 56 positions shown; findings below may reference images not displayed]

FINDINGS: Prostate: Demonstrates mild central gland enlargement and
heterogeneity, consistent with benign prostatic hyperplasia. Areas
of mildly heterogeneous peripheral zone T2 signal, with the most
focal areas identified within the lateral right mid to apical gland
including on image 16/ series 7. These are relatively vague and non
masslike. No areas of restricted diffusion or early post-contrast
enhancement. [REDACTED]
Volume: 3.4 x 4.4 x 4.0 cm (volume = 31 cm^3).

Transcapsular spread:  Absent

Seminal vesicle involvement: Absent

Neurovascular bundle involvement: Absent

Pelvic adenopathy: Absent

Bone metastasis: Absent

Other findings: No significant free fluid.  Sigmoid diverticulosis.
IMPRESSION: No findings to suggest macroscopic or high-grade prostate cancer.
Areas of vague heterogeneous T2 signal, including within the right
mid to apical gland laterally are without correlate on
diffusion-weighted or post-contrast dynamic imaging. [REDACTED]

## 2018-07-26 DIAGNOSIS — H5213 Myopia, bilateral: Secondary | ICD-10-CM | POA: Diagnosis not present

## 2018-07-26 DIAGNOSIS — H47323 Drusen of optic disc, bilateral: Secondary | ICD-10-CM | POA: Diagnosis not present

## 2018-07-26 DIAGNOSIS — H5989 Other postprocedural complications and disorders of eye and adnexa, not elsewhere classified: Secondary | ICD-10-CM | POA: Diagnosis not present

## 2018-08-13 ENCOUNTER — Other Ambulatory Visit: Payer: Self-pay

## 2018-08-13 ENCOUNTER — Encounter: Payer: Self-pay | Admitting: Neurology

## 2018-08-13 ENCOUNTER — Ambulatory Visit (INDEPENDENT_AMBULATORY_CARE_PROVIDER_SITE_OTHER): Payer: Medicare Other | Admitting: Neurology

## 2018-08-13 DIAGNOSIS — R413 Other amnesia: Secondary | ICD-10-CM

## 2018-08-13 MED ORDER — DONEPEZIL HCL 5 MG PO TABS: 5.0000 mg | ORAL_TABLET | Freq: Every day | ORAL | 1 refills | Status: DC

## 2018-08-13 NOTE — Patient Instructions (Signed)
We will start Aricept for the memory.   Begin Aricept (donepezil) at 5 mg at night for one month. If this medication is well-tolerated, please call our office and we will call in a prescription for the 10 mg tablets. Look out for side effects that may include nausea, diarrhea, weight loss, or stomach cramps. This medication will also cause a runny nose, therefore there is no need for allergy medications for this purpose.  

## 2018-08-13 NOTE — Progress Notes (Signed)
Reason for visit: Memory disturbance  Referring physician: Dr. Lavinia Sharps is a 73 y.o. male  History of present illness:  Brad Shaw is a 73 year old left-handed white male with a history of some memory problems that have developed over the last year.  The patient has been in relatively good physical health, he does have a history of hypertension.  The patient comes in with his wife today who indicates that she has noted that he has been repeating himself frequently over the last year.  He will ask the same questions again and again.  He has had several episodes where he will get lost going to stores that he has been to before.  The patient still operates a motor vehicle.  The patient is having difficulty figuring out how to use a computer or the telephone.  His wife now does the finances online, she has been doing this for about 1 year.  She is not sure whether or not her husband could figure out how to use the computer to pay the bills.  The patient is able to keep up with his medications and appointments.  He reports no problems with sleeping at night, he has a good energy level during the day.  He denies any significant numbness or weakness on the face, arms, or legs with exception that occasionally the right thumb may become numb.  The patient denies any balance issues or difficulty controlling the bowels or the bladder.  He denies a family history of memory problems.  He is sent to this office for an evaluation.  Prior blood work included a normal B12 level and thyroid profile.  Past Medical History:  Diagnosis Date  . BPH (benign prostatic hyperplasia)   . High cholesterol   . Hypertension     Past Surgical History:  Procedure Laterality Date  . TONSILLECTOMY      Family History  Problem Relation Age of Onset  . Cirrhosis Mother   . Heart disease Father     Social history:  reports that he has never smoked. He has never used smokeless tobacco. He reports current  alcohol use. He reports that he does not use drugs.  Medications:  Prior to Admission medications   Medication Sig Start Date End Date Taking? Authorizing Provider  amLODipine (NORVASC) 10 MG tablet Take 10 mg by mouth daily. 07/15/18  Yes [provider]  ASPIRIN 81 PO Take by mouth.   Yes [provider]  atorvastatin (LIPITOR) 40 MG tablet  06/25/18  Yes [provider]  cyclobenzaprine (FLEXERIL) 5 MG tablet  05/10/18  Yes [provider]  finasteride (PROSCAR) 5 MG tablet  06/16/18  Yes [provider]  ramipril (ALTACE) 10 MG capsule Take 10 mg by mouth 2 (two) times daily. 07/31/18  Yes [provider]     No Known Allergies  ROS:  Out of a complete 14 system review of symptoms, the patient complains only of the following symptoms, and all other reviewed systems are negative.  Low back pain Memory loss  Blood pressure (!) 152/82, pulse (!) 119, height 6' (1.829 m), weight 193 lb 5 oz (87.7 kg).  Physical Exam  General: The patient is alert and cooperative at the time of the examination.  Eyes: Pupils are equal, round, and reactive to light. Discs are flat bilaterally.  Neck: The neck is supple, no carotid bruits are noted.  Respiratory: The respiratory examination is clear.  Cardiovascular: The cardiovascular examination reveals  a regular rate and rhythm, no obvious murmurs or rubs are noted.  Skin: Extremities are without significant edema.  Neurologic Exam  Mental status: The patient is alert and oriented x 2 at the time of the examination (not oriented to date). The Mini-Mental status examination done today shows a total score of 19/30.  Cranial nerves: Facial symmetry is present. There is good sensation of the face to pinprick and soft touch bilaterally. The strength of the facial muscles and the muscles to head turning and shoulder shrug are normal bilaterally. Speech is well enunciated, no aphasia or dysarthria is  noted. Extraocular movements are full. Visual fields are full. The tongue is midline, and the patient has symmetric elevation of the soft palate. No obvious hearing deficits are noted.  Motor: The motor testing reveals 5 over 5 strength of all 4 extremities. Good symmetric motor tone is noted throughout.  Sensory: Sensory testing is intact to pinprick, soft touch, vibration sensation, and position sense on all 4 extremities. No evidence of extinction is noted.  Coordination: Cerebellar testing reveals good finger-nose-finger and heel-to-shin bilaterally.  Gait and station: Gait is normal. Tandem gait is normal. Romberg is negative. No drift is seen.  Reflexes: Deep tendon reflexes are symmetric and normal bilaterally. Toes are downgoing bilaterally.   Assessment/Plan:  1.  Memory disturbance  The patient will be sent for MRI evaluation of the brain.  He will be placed on low-dose Aricept, he will call for the maintenance dose prescription if he tolerates the 5 mg dosing.  He will follow-up here in about 6 months.  In the future if desired, the patient may be a good candidate for memory research.  Jill Alexanders MD 08/13/2018 9:08 AM  [facility] [address] [place],  [address]  Phone [phone] Fax [phone]

## 2018-08-16 ENCOUNTER — Telehealth: Payer: Self-pay | Admitting: Neurology

## 2018-08-16 NOTE — Telephone Encounter (Signed)
Medicare/[employer] supp order sent to [facility]. No auth they will reach out to the pt to schedule.

## 2018-09-04 ENCOUNTER — Other Ambulatory Visit: Payer: Self-pay | Admitting: Neurology

## 2018-09-06 ENCOUNTER — Telehealth: Payer: Self-pay | Admitting: Neurology

## 2018-09-06 MED ORDER — DONEPEZIL HCL 10 MG PO TABS: 10.0000 mg | ORAL_TABLET | Freq: Every day | ORAL | 3 refills | Status: DC

## 2018-09-06 NOTE — Telephone Encounter (Signed)
Pt's wife states that the pt did not have a reaction to the donepezil (ARICEPT) 5 MG tablet and that he is ready to move up to the 10mg  and have it sent to the [facility] in [facility]. Please advise.

## 2018-09-06 NOTE — Telephone Encounter (Signed)
I reviewed the pt's chart and rx for 10 mg is appropriate. Rx for a 30 day supply with 3 refills has been sent.

## 2018-09-16 ENCOUNTER — Other Ambulatory Visit: Payer: 59

## 2018-10-29 ENCOUNTER — Ambulatory Visit
Admission: RE | Admit: 2018-10-29 | Discharge: 2018-10-29 | Disposition: A | Discharge status: Home / Self Care | Payer: Medicare Other | Source: Ambulatory Visit | Attending: Neurology | Admitting: Neurology

## 2018-10-29 ENCOUNTER — Other Ambulatory Visit: Payer: Self-pay

## 2018-10-29 DIAGNOSIS — R413 Other amnesia: Secondary | ICD-10-CM | POA: Diagnosis not present

## 2018-10-31 ENCOUNTER — Telehealth: Payer: Self-pay | Admitting: Neurology

## 2018-10-31 NOTE — Telephone Encounter (Signed)
I called the patient.  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  The MRI does show atrophy.  The patient is on Aricept and is tolerating the medication well.  They wish to have a copy of the MRI results.   MRI brain 10/30/18:  IMPRESSION: This MRI of the brain without contrast shows the following: 1.   Moderate generalized cortical atrophy that is most pronounced in the mesial temporal lobes and mild cerebellar and corpus callosal atrophy. 2.   T2/flair hyperintense foci most consistent with mild chronic microvascular ischemic changes. 3.   There are no acute findings.

## 2018-11-01 ENCOUNTER — Telehealth: Payer: Self-pay | Admitting: *Deleted

## 2018-11-01 NOTE — Telephone Encounter (Signed)
Done

## 2018-11-30 ENCOUNTER — Other Ambulatory Visit: Payer: Self-pay | Admitting: Neurology

## 2018-12-13 DIAGNOSIS — R3121 Asymptomatic microscopic hematuria: Secondary | ICD-10-CM | POA: Diagnosis not present

## 2018-12-13 DIAGNOSIS — R972 Elevated prostate specific antigen [PSA]: Secondary | ICD-10-CM | POA: Diagnosis not present

## 2018-12-22 DIAGNOSIS — M545 Low back pain: Secondary | ICD-10-CM | POA: Diagnosis not present

## 2018-12-22 DIAGNOSIS — M419 Scoliosis, unspecified: Secondary | ICD-10-CM | POA: Diagnosis not present

## 2018-12-22 DIAGNOSIS — M5136 Other intervertebral disc degeneration, lumbar region: Secondary | ICD-10-CM | POA: Diagnosis not present

## 2019-01-05 DIAGNOSIS — M545 Low back pain: Secondary | ICD-10-CM | POA: Diagnosis not present

## 2019-02-13 NOTE — Progress Notes (Signed)
PATIENT: Brad Shaw DOB: 1945-10-31  REASON FOR VISIT: follow up HISTORY FROM: patient  HISTORY OF PRESENT ILLNESS: Today 02/14/19  Brad Shaw is a 73 year old male with history of memory disturbance.  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  His last memory score was 19/30.  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  He remains on aspirin and Aricept.  He continues to report difficulty with his short-term memory.  He is able to manage his medications, but he needs help remembering his appointments.  He lives with his wife.  His wife manages their finances, which she always has.  He does drive a car without difficulty, but in the past there have been 2 incidents where he has gotten lost.  He has a good appetite.  He sleeps fair at night, but he does take over-the-counter sleep aids.  He is scheduled to have a prostate biopsy September 28 for elevated PSA.  They have a son who is a doctor who they wish discuss healthcare decisions with.  He is a retired Chief Financial Officer.  He presents today for follow-up accompanied by his wife.  HISTORY 08/13/2018 Dr. Jannifer Franklin: [name] is a 73 year old left-handed white male with a history of some memory problems that have developed over the last year.  The patient has been in relatively good physical health, he does have a history of hypertension.  The patient comes in with his wife today who indicates that she has noted that he has been repeating himself frequently over the last year.  He will ask the same questions again and again.  He has had several episodes where he will get lost going to stores that he has been to before.  The patient still operates a motor vehicle.  The patient is having difficulty figuring out how to use a computer or the telephone.  His wife now does the finances online, she has been doing this for about 1 year.  She is not sure whether or not her husband could figure  out how to use the computer to pay the bills.  The patient is able to keep up with his medications and appointments.  He reports no problems with sleeping at night, he has a good energy level during the day.  He denies any significant numbness or weakness on the face, arms, or legs with exception that occasionally the right thumb may become numb.  The patient denies any balance issues or difficulty controlling the bowels or the bladder.  He denies a family history of memory problems.  He is sent to this office for an evaluation.  Prior blood work included a normal B12 level and thyroid profile.   REVIEW OF SYSTEMS: Out of a complete 14 system review of symptoms, the patient complains only of the following symptoms, and all other reviewed systems are negative.  Memory loss, dizziness  ALLERGIES: No Known Allergies  HOME MEDICATIONS: Outpatient Medications Prior to Visit  Medication Sig Dispense Refill  . amLODipine (NORVASC) 10 MG tablet Take 10 mg by mouth daily.    . ASPIRIN 81 PO Take by mouth.    Marland Kitchen atorvastatin (LIPITOR) 40 MG tablet     . cyclobenzaprine (FLEXERIL) 5 MG tablet     . donepezil (ARICEPT) 10 MG tablet TAKE 1 TABLET BY MOUTH EVERYDAY AT BEDTIME 90 tablet 0  . finasteride (PROSCAR) 5 MG tablet     .  ramipril (ALTACE) 10 MG capsule Take 10 mg by mouth 2 (two) times daily.    . Turmeric 1053 MG TABS Takes on and off     No facility-administered medications prior to visit.     PAST MEDICAL HISTORY: Past Medical History:  Diagnosis Date  . BPH (benign prostatic hyperplasia)   . High cholesterol   . Hypertension     PAST SURGICAL HISTORY: Past Surgical History:  Procedure Laterality Date  . TONSILLECTOMY      FAMILY HISTORY: Family History  Problem Relation Age of Onset  . Cirrhosis Mother   . Heart disease Father     SOCIAL HISTORY: Social History   Socioeconomic History  . Marital status: Married    Spouse name: Not on file  . Number of children: Not on  file  . Years of education: Not on file  . Highest education level: Not on file  Occupational History  . Not on file  Social Needs  . Financial resource strain: Not on file  . Food insecurity    Worry: Not on file    Inability: Not on file  . Transportation needs    Medical: Not on file    Non-medical: Not on file  Tobacco Use  . Smoking status: Never Smoker  . Smokeless tobacco: Never Used  Substance and Sexual Activity  . Alcohol use: Yes    Comment: 3-4 per week  . Drug use: No  . Sexual activity: Not on file  Lifestyle  . Physical activity    Days per week: Not on file    Minutes per session: Not on file  . Stress: Not on file  Relationships  . Social Herbalist on phone: Not on file    Gets together: Not on file    Attends religious service: Not on file    Active member of club or organization: Not on file    Attends meetings of clubs or organizations: Not on file    Relationship status: Not on file  . Intimate partner violence    Fear of current or ex partner: Not on file    Emotionally abused: Not on file    Physically abused: Not on file    Forced sexual activity: Not on file  Other Topics Concern  . Not on file  Social History Narrative   Left handed    Caffeine 1/2 cups daily    Live at home with spouse       PHYSICAL EXAM  Vitals:   02/14/19 0956  BP: 125/69  Pulse: 83  Temp: 98 F (36.7 C)  Weight: 197 lb 9.6 oz (89.6 kg)  Height: 6' (1.829 m)   Body mass index is 26.8 kg/m.  Generalized: Well developed, in no acute distress, well-groomed MMSE - Mini Mental State Exam 02/14/2019 08/13/2018  Orientation to time 4 3  Orientation to Place 3 2  Registration 3 3  Attention/ Calculation 1 1  Recall 1 1  Language- name 2 objects 2 2  Language- repeat 1 1  Language- follow 3 step command 3 3  Language- read & follow direction 1 1  Write a sentence 1 1  Copy design 1 1  Copy design-comments named 5 animals -  Total score 21 19     Neurological examination  Mentation: Alert oriented to time, place, history taking. Follows all commands speech and language fluent Cranial nerve II-XII: Pupils were equal round reactive to light. Extraocular movements were full, visual  field were full on confrontational test. Facial sensation and strength were normal. . Head turning and shoulder shrug  were normal and symmetric. Motor: The motor testing reveals 5 over 5 strength of all 4 extremities. Good symmetric motor tone is noted throughout.  Sensory: Sensory testing is intact to soft touch on all 4 extremities. No evidence of extinction is noted.  Coordination: Cerebellar testing reveals good finger-nose-finger and heel-to-shin bilaterally.  Gait and station: Gait is normal. Tandem gait is normal. Romberg is negative. No drift is seen.  Reflexes: Deep tendon reflexes are symmetric and normal bilaterally.   DIAGNOSTIC DATA (LABS, IMAGING, TESTING) - I reviewed patient records, labs, notes, testing and imaging myself where available.  No results found for: WBC, HGB, HCT, MCV, PLT No results found for: NA, K, CL, CO2, GLUCOSE, BUN, CREATININE, CALCIUM, PROT, ALBUMIN, AST, ALT, ALKPHOS, BILITOT, GFRNONAA, GFRAA No results found for: CHOL, HDL, LDLCALC, LDLDIRECT, TRIG, CHOLHDL No results found for: HGBA1C No results found for: VITAMINB12 No results found for: TSH  MRI of the brain 10/29/2018  IMPRESSION: This MRI of the brain without contrast shows the following: 1.   Moderate generalized cortical atrophy that is most pronounced in the mesial temporal lobes and mild cerebellar and corpus callosal atrophy. 2.   T2/flair hyperintense foci most consistent with mild chronic microvascular ischemic changes. 3.   There are no acute findings.  ASSESSMENT AND PLAN 73 y.o. year old male  has a past medical history of BPH (benign prostatic hyperplasia), High cholesterol, and Hypertension. here with:  1.  Memory disturbance  His memory score  is stable, actually improved from prior today 21/30.  He will remain on Aricept.  I have provided a printed prescription for Namenda titration.  He is scheduled to have a prostate biopsy in 2 weeks.  He plans to start Namenda after that time.  We discussed side effects of medication.  He is interested in possibly participating in memory research, I will provide his information to the research team.  MRI of the brain in May 2020 showed atrophy and mild to moderate small vessel disease.  He has remained on aspirin.  He will follow-up in 6 months or sooner if needed.  I did advise if his symptoms worsen or if he develops any new symptoms he should let us know.  I spent 15 minutes with the patient. 50% of this time was spent discussing his plan of care.  Butler Denmark, AGNP-C, DNP 02/14/2019, 10:17 AM [facility] [address], [place],  [address] [phone]

## 2019-02-14 ENCOUNTER — Other Ambulatory Visit: Payer: Self-pay

## 2019-02-14 ENCOUNTER — Encounter: Payer: Self-pay | Admitting: Neurology

## 2019-02-14 ENCOUNTER — Ambulatory Visit (INDEPENDENT_AMBULATORY_CARE_PROVIDER_SITE_OTHER): Payer: Medicare Other | Admitting: Neurology

## 2019-02-14 DIAGNOSIS — R413 Other amnesia: Secondary | ICD-10-CM | POA: Diagnosis not present

## 2019-02-14 MED ORDER — DONEPEZIL HCL 10 MG PO TABS: ORAL_TABLET | ORAL | 1 refills | Status: DC

## 2019-02-14 MED ORDER — MEMANTINE HCL 5 MG PO TABS: ORAL_TABLET | ORAL | 0 refills | Status: DC

## 2019-02-14 NOTE — Patient Instructions (Signed)
Namenda Titration: Take 1 tablet daily for one week, then take 1 tablet twice daily for one week, then take 1 tablet in the morning and 2 in the evening for one week, then take 2 tablets twice daily  Continue Aricept.

## 2019-02-14 NOTE — Progress Notes (Signed)
I have read the note, and I agree with the clinical assessment and plan.  Charles K Willis   

## 2019-02-17 DIAGNOSIS — Z23 Encounter for immunization: Secondary | ICD-10-CM | POA: Diagnosis not present

## 2019-02-28 DIAGNOSIS — R972 Elevated prostate specific antigen [PSA]: Secondary | ICD-10-CM | POA: Diagnosis not present

## 2019-02-28 DIAGNOSIS — D075 Carcinoma in situ of prostate: Secondary | ICD-10-CM | POA: Diagnosis not present

## 2019-02-28 DIAGNOSIS — C61 Malignant neoplasm of prostate: Secondary | ICD-10-CM | POA: Diagnosis not present

## 2019-03-07 DIAGNOSIS — M5136 Other intervertebral disc degeneration, lumbar region: Secondary | ICD-10-CM | POA: Diagnosis not present

## 2019-03-07 DIAGNOSIS — R3121 Asymptomatic microscopic hematuria: Secondary | ICD-10-CM | POA: Diagnosis not present

## 2019-03-07 DIAGNOSIS — C61 Malignant neoplasm of prostate: Secondary | ICD-10-CM | POA: Diagnosis not present

## 2019-03-07 DIAGNOSIS — R413 Other amnesia: Secondary | ICD-10-CM | POA: Diagnosis not present

## 2019-03-07 DIAGNOSIS — I10 Essential (primary) hypertension: Secondary | ICD-10-CM | POA: Diagnosis not present

## 2019-03-07 DIAGNOSIS — E7800 Pure hypercholesterolemia, unspecified: Secondary | ICD-10-CM | POA: Diagnosis not present

## 2019-03-07 DIAGNOSIS — I700 Atherosclerosis of aorta: Secondary | ICD-10-CM | POA: Diagnosis not present

## 2019-03-08 DIAGNOSIS — R972 Elevated prostate specific antigen [PSA]: Secondary | ICD-10-CM | POA: Diagnosis not present

## 2019-03-23 ENCOUNTER — Other Ambulatory Visit: Payer: Self-pay | Admitting: Neurology

## 2019-03-24 ENCOUNTER — Other Ambulatory Visit: Payer: Self-pay | Admitting: *Deleted

## 2019-03-24 MED ORDER — MEMANTINE HCL 10 MG PO TABS: 10.0000 mg | ORAL_TABLET | Freq: Two times a day (BID) | ORAL | 1 refills | Status: DC

## 2019-04-16 ENCOUNTER — Other Ambulatory Visit: Payer: Self-pay | Admitting: Neurology

## 2019-05-06 DIAGNOSIS — L918 Other hypertrophic disorders of the skin: Secondary | ICD-10-CM | POA: Diagnosis not present

## 2019-06-23 ENCOUNTER — Ambulatory Visit: Payer: Medicare Other | Attending: Internal Medicine

## 2019-06-23 DIAGNOSIS — Z23 Encounter for immunization: Secondary | ICD-10-CM | POA: Insufficient documentation

## 2019-06-23 NOTE — Progress Notes (Signed)
.    Covid-19 Vaccination Clinic  Name:  Brad Shaw    MRN: [id number] DOB: 11-Feb-1946  06/23/2019  [name] was observed post Covid-19 immunization for 15 minutes without incidence. He was provided with Vaccine Information Sheet and instruction to access the V-Safe system.   [name] was instructed to call 911 with any severe reactions post vaccine: Marland Kitchen Difficulty breathing  . Swelling of your face and throat  . A fast heartbeat  . A bad rash all over your body  . Dizziness and weakness    Immunizations Administered    Name Date Dose VIS Date Route   Pfizer COVID-19 Vaccine 06/23/2019  1:06 PM 0.3 mL 05/13/2019 Intramuscular   Manufacturer: [place]   Lot: [id number]   [place]: [id number]

## 2019-06-29 DIAGNOSIS — I10 Essential (primary) hypertension: Secondary | ICD-10-CM | POA: Diagnosis not present

## 2019-06-29 DIAGNOSIS — R0789 Other chest pain: Secondary | ICD-10-CM | POA: Diagnosis not present

## 2019-06-29 DIAGNOSIS — E7800 Pure hypercholesterolemia, unspecified: Secondary | ICD-10-CM | POA: Diagnosis not present

## 2019-06-29 DIAGNOSIS — Z0000 Encounter for general adult medical examination without abnormal findings: Secondary | ICD-10-CM | POA: Diagnosis not present

## 2019-06-30 DIAGNOSIS — Z1152 Encounter for screening for COVID-19: Secondary | ICD-10-CM | POA: Diagnosis not present

## 2019-06-30 DIAGNOSIS — R7301 Impaired fasting glucose: Secondary | ICD-10-CM | POA: Diagnosis not present

## 2019-07-14 ENCOUNTER — Ambulatory Visit: Payer: Medicare Other | Attending: Internal Medicine

## 2019-07-14 DIAGNOSIS — Z23 Encounter for immunization: Secondary | ICD-10-CM | POA: Insufficient documentation

## 2019-07-14 NOTE — Progress Notes (Signed)
   Covid-19 Vaccination Clinic  Name:  Brad Shaw    MRN: [id number] DOB: 1945-11-01  07/14/2019  [name] was observed post Covid-19 immunization for 15 minutes without incidence. He was provided with Vaccine Information Sheet and instruction to access the V-Safe system.   [name] was instructed to call 911 with any severe reactions post vaccine: Marland Kitchen Difficulty breathing  . Swelling of your face and throat  . A fast heartbeat  . A bad rash all over your body  . Dizziness and weakness    Immunizations Administered    Name Date Dose VIS Date Route   Pfizer COVID-19 Vaccine 07/14/2019  1:43 PM 0.3 mL 05/13/2019 Intramuscular   Manufacturer: [employer], [employer]   Lot: [id number]   [place]: [id number]

## 2019-08-04 ENCOUNTER — Other Ambulatory Visit: Payer: Self-pay | Admitting: Neurology

## 2019-08-15 ENCOUNTER — Ambulatory Visit (INDEPENDENT_AMBULATORY_CARE_PROVIDER_SITE_OTHER): Payer: Medicare Other | Admitting: Neurology

## 2019-08-15 ENCOUNTER — Other Ambulatory Visit: Payer: Self-pay

## 2019-08-15 ENCOUNTER — Encounter: Payer: Self-pay | Admitting: Neurology

## 2019-08-15 VITALS — BP 141/68 | HR 83 | Temp 97.8°F | Ht 72.0 in | Wt 200.2 lb

## 2019-08-15 DIAGNOSIS — R413 Other amnesia: Secondary | ICD-10-CM

## 2019-08-15 MED ORDER — DONEPEZIL HCL 10 MG PO TABS: ORAL_TABLET | ORAL | 3 refills | Status: DC

## 2019-08-15 MED ORDER — MEMANTINE HCL 10 MG PO TABS: 10.0000 mg | ORAL_TABLET | Freq: Two times a day (BID) | ORAL | 3 refills | Status: DC

## 2019-08-15 NOTE — Patient Instructions (Addendum)
Continue taking current medications, Namenda and Aricept, Aspirin  Memory score was 16/30 Closely monitor the driving  Follow up in 1 year

## 2019-08-15 NOTE — Progress Notes (Signed)
PATIENT: Brad Shaw DOB: Aug 23, 1945  REASON FOR VISIT: follow up HISTORY FROM: patient  HISTORY OF PRESENT ILLNESS: Today 08/15/19  Brad Shaw is a 74 year old male with history of memory disturbance.  MRI of the brain has shown mild to moderate small vessel disease and atrophy.  He remains on aspirin and Aricept.  He lives with his wife.  He was started on Namenda at last visit.  He is tolerating medications well.  Since last seen, feels that short-term memory has declined some and has word finding difficulty.  He has a hard time remembering things he does not do often.  He may have difficulty finding things in the kitchen.  He and his wife go out with friends, he interacts well.  He sleeps well, has a good appetite.  He continues to drive, but only with his wife in the car.  He will have difficulty with directions without her.  He does his own daily activities and manages his medications.  He has had elevated PSA, is currently in surveillance.  He enjoys watching TV and doing woodworking.  They have been traveling recently to see family.  His sister recently passed away.  He thinks that not being home, has disrupted his pattern.  He presents today for evaluation accompanied by his wife.  HISTORY 02/14/2019 SS: Brad Shaw is a 74 year old male with history of memory disturbance.  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  His last memory score was 19/30.  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  He remains on aspirin and Aricept.  He continues to report difficulty with his short-term memory.  He is able to manage his medications, but he needs help remembering his appointments.  He lives with his wife.  His wife manages their finances, which she always has.  He does drive a car without difficulty, but in the past there have been 2 incidents where he has gotten lost.  He has a good appetite.  He sleeps fair at night, but he  does take over-the-counter sleep aids.  He is scheduled to have a prostate biopsy September 28 for elevated PSA.  They have a son who is a doctor who they wish discuss healthcare decisions with.  He is a retired Chief Financial Officer.  He presents today for follow-up accompanied by his wife.   REVIEW OF SYSTEMS: Out of a complete 14 system review of symptoms, the patient complains only of the following symptoms, and all other reviewed systems are negative.  Memory loss  ALLERGIES: No Known Allergies  HOME MEDICATIONS: Outpatient Medications Prior to Visit  Medication Sig Dispense Refill  . amLODipine (NORVASC) 10 MG tablet Take 10 mg by mouth daily.    . ASPIRIN 81 PO Take by mouth daily.     Marland Kitchen atorvastatin (LIPITOR) 40 MG tablet Take 40 mg by mouth daily at 6 PM.     . cyclobenzaprine (FLEXERIL) 5 MG tablet Take 5 mg by mouth daily as needed.     . donepezil (ARICEPT) 10 MG tablet TAKE 1 TABLET BY MOUTH EVERYDAY AT BEDTIME 90 tablet 1  . finasteride (PROSCAR) 5 MG tablet Take 5 mg by mouth daily.     . memantine (NAMENDA) 10 MG tablet Take 1 tablet (10 mg total) by mouth 2 (two) times daily. 180 tablet 1  . ramipril (ALTACE) 10 MG capsule Take 10 mg by mouth 2 (two) times  daily.    . Turmeric 1053 MG TABS daily. Takes on and off     No facility-administered medications prior to visit.    PAST MEDICAL HISTORY: Past Medical History:  Diagnosis Date  . BPH (benign prostatic hyperplasia)   . High cholesterol   . Hypertension     PAST SURGICAL HISTORY: Past Surgical History:  Procedure Laterality Date  . TONSILLECTOMY      FAMILY HISTORY: Family History  Problem Relation Age of Onset  . Cirrhosis Mother   . Heart disease Father     SOCIAL HISTORY: Social History   Socioeconomic History  . Marital status: Married    Spouse name: Not on file  . Number of children: Not on file  . Years of education: Not on file  . Highest education level: Not on file  Occupational History  . Not  on file  Tobacco Use  . Smoking status: Never Smoker  . Smokeless tobacco: Never Used  Substance and Sexual Activity  . Alcohol use: Yes    Comment: 3-4 per week  . Drug use: No  . Sexual activity: Not on file  Other Topics Concern  . Not on file  Social History Narrative   Left handed    Caffeine 1/2 cups daily    Live at home with spouse    Social Determinants of Health   Financial Resource Strain:   . Difficulty of Paying Living Expenses:   Food Insecurity:   . Worried About Charity fundraiser in the Last Year:   . Arboriculturist in the Last Year:   Transportation Needs:   . Film/video editor (Medical):   Marland Kitchen Lack of Transportation (Non-Medical):   Physical Activity:   . Days of Exercise per Week:   . Minutes of Exercise per Session:   Stress:   . Feeling of Stress :   Social Connections:   . Frequency of Communication with Friends and Family:   . Frequency of Social Gatherings with Friends and Family:   . Attends Religious Services:   . Active Member of Clubs or Organizations:   . Attends Archivist Meetings:   Marland Kitchen Marital Status:   Intimate Partner Violence:   . Fear of Current or Ex-Partner:   . Emotionally Abused:   Marland Kitchen Physically Abused:   . Sexually Abused:     PHYSICAL EXAM  Vitals:   08/15/19 1012  BP: (!) 141/68  Pulse: 83  Temp: 97.8 F (36.6 C)  Weight: 200 lb 3.2 oz (90.8 kg)  Height: 6' (1.829 m)   Body mass index is 27.15 kg/m.  Generalized: Well developed, in no acute distress  MMSE - Mini Mental State Exam 08/15/2019 02/14/2019 08/13/2018  Orientation to time 1 4 3   Orientation to Place 2 3 2   Registration 2 3 3   Attention/ Calculation 2 1 1   Recall 0 1 1  Language- name 2 objects 2 2 2   Language- repeat 1 1 1   Language- follow 3 step command 3 3 3   Language- read & follow direction 1 1 1   Write a sentence 1 1 1   Copy design 1 1 1   Copy design-comments - named 5 animals -  Total score 16 21 19     Neurological  examination  Mentation: Alert oriented to time, place, history taking. Follows all commands speech and language fluent Cranial nerve II-XII: Pupils were equal round reactive to light. Extraocular movements were full, visual field were full on confrontational test.  Facial sensation and strength were normal. Head turning and shoulder shrug were normal and symmetric. Motor: The motor testing reveals 5 over 5 strength of all 4 extremities. Good symmetric motor tone is noted throughout.  Sensory: Sensory testing is intact to soft touch on all 4 extremities. No evidence of extinction is noted.  Coordination: Cerebellar testing reveals good finger-nose-finger and heel-to-shin bilaterally.  Gait and station: Gait is normal. Tandem gait is normal.  Reflexes: Deep tendon reflexes are symmetric   DIAGNOSTIC DATA (LABS, IMAGING, TESTING) - I reviewed patient records, labs, notes, testing and imaging myself where available.  No results found for: WBC, HGB, HCT, MCV, PLT No results found for: NA, K, CL, CO2, GLUCOSE, BUN, CREATININE, CALCIUM, PROT, ALBUMIN, AST, ALT, ALKPHOS, BILITOT, GFRNONAA, GFRAA No results found for: CHOL, HDL, LDLCALC, LDLDIRECT, TRIG, CHOLHDL No results found for: HGBA1C No results found for: VITAMINB12 No results found for: TSH    ASSESSMENT AND PLAN 74 y.o. year old male  has a past medical history of BPH (benign prostatic hyperplasia), High cholesterol, and Hypertension. here with:  1.  Memory disturbance 2.  Small vessel disease by MRI, history of hypertension  He has had some decline in his memory score 16/30, he does have difficulty with word finding, that seems to impact his score.  He will remain on Aricept and Namenda, along with aspirin.  His wife should closely monitor his driving, fortunately, he only drives with her in the car.  He appears to continue to function quite well in the home.  He will follow-up in 1 year or sooner if needed.  We did discuss, his primary  doctor may take over following his memory.  I spent 15 minutes with the patient. 50% of this time was spent discussing his plan of care.  Butler Denmark, AGNP-C, DNP 08/15/2019, 10:35 AM [facility] [address] [place], [place] [address] [phone]

## 2019-08-15 NOTE — Progress Notes (Signed)
I have read the note, and I agree with the clinical assessment and plan.  Saniyyah Elster K Audia Amick   

## 2019-09-08 ENCOUNTER — Other Ambulatory Visit: Payer: Self-pay | Admitting: Neurology

## 2019-09-19 DIAGNOSIS — N5201 Erectile dysfunction due to arterial insufficiency: Secondary | ICD-10-CM | POA: Diagnosis not present

## 2019-09-19 DIAGNOSIS — R3121 Asymptomatic microscopic hematuria: Secondary | ICD-10-CM | POA: Diagnosis not present

## 2019-09-19 DIAGNOSIS — C61 Malignant neoplasm of prostate: Secondary | ICD-10-CM | POA: Diagnosis not present

## 2019-11-08 DIAGNOSIS — H2513 Age-related nuclear cataract, bilateral: Secondary | ICD-10-CM | POA: Diagnosis not present

## 2020-02-16 ENCOUNTER — Encounter: Payer: Self-pay | Admitting: Neurology

## 2020-02-17 DIAGNOSIS — Z23 Encounter for immunization: Secondary | ICD-10-CM | POA: Diagnosis not present

## 2020-03-07 DIAGNOSIS — E7800 Pure hypercholesterolemia, unspecified: Secondary | ICD-10-CM | POA: Diagnosis not present

## 2020-03-07 DIAGNOSIS — C61 Malignant neoplasm of prostate: Secondary | ICD-10-CM | POA: Diagnosis not present

## 2020-03-07 DIAGNOSIS — R5383 Other fatigue: Secondary | ICD-10-CM | POA: Diagnosis not present

## 2020-03-07 DIAGNOSIS — Z23 Encounter for immunization: Secondary | ICD-10-CM | POA: Diagnosis not present

## 2020-03-12 DIAGNOSIS — C61 Malignant neoplasm of prostate: Secondary | ICD-10-CM | POA: Diagnosis not present

## 2020-03-12 DIAGNOSIS — N5201 Erectile dysfunction due to arterial insufficiency: Secondary | ICD-10-CM | POA: Diagnosis not present

## 2020-03-13 ENCOUNTER — Other Ambulatory Visit: Payer: Self-pay | Admitting: Urology

## 2020-03-13 DIAGNOSIS — C61 Malignant neoplasm of prostate: Secondary | ICD-10-CM

## 2020-04-03 ENCOUNTER — Ambulatory Visit
Admission: RE | Admit: 2020-04-03 | Discharge: 2020-04-03 | Disposition: A | Discharge status: Home / Self Care | Payer: Medicare Other | Source: Ambulatory Visit | Attending: Urology | Admitting: Urology

## 2020-04-03 ENCOUNTER — Other Ambulatory Visit: Payer: Self-pay

## 2020-04-03 DIAGNOSIS — R972 Elevated prostate specific antigen [PSA]: Secondary | ICD-10-CM | POA: Diagnosis not present

## 2020-04-03 DIAGNOSIS — C61 Malignant neoplasm of prostate: Secondary | ICD-10-CM

## 2020-04-03 DIAGNOSIS — K5730 Diverticulosis of large intestine without perforation or abscess without bleeding: Secondary | ICD-10-CM | POA: Diagnosis not present

## 2020-04-03 DIAGNOSIS — R590 Localized enlarged lymph nodes: Secondary | ICD-10-CM | POA: Diagnosis not present

## 2020-04-03 MED ORDER — GADOBENATE DIMEGLUMINE 529 MG/ML IV SOLN
18.0000 mL | Freq: Once | INTRAVENOUS | Status: AC | PRN
  Administered 2020-04-03: 18 mL via INTRAVENOUS

## 2020-05-01 DIAGNOSIS — M545 Low back pain, unspecified: Secondary | ICD-10-CM | POA: Diagnosis not present

## 2020-05-29 DIAGNOSIS — M545 Low back pain, unspecified: Secondary | ICD-10-CM | POA: Diagnosis not present

## 2020-06-18 ENCOUNTER — Ambulatory Visit: Payer: 59 | Admitting: Podiatry

## 2020-06-20 DIAGNOSIS — M545 Low back pain, unspecified: Secondary | ICD-10-CM | POA: Diagnosis not present

## 2020-06-20 DIAGNOSIS — M5136 Other intervertebral disc degeneration, lumbar region: Secondary | ICD-10-CM | POA: Diagnosis not present

## 2020-06-25 DIAGNOSIS — M5136 Other intervertebral disc degeneration, lumbar region: Secondary | ICD-10-CM | POA: Diagnosis not present

## 2020-06-25 DIAGNOSIS — M545 Low back pain, unspecified: Secondary | ICD-10-CM | POA: Diagnosis not present

## 2020-06-27 ENCOUNTER — Encounter: Payer: Self-pay | Admitting: Podiatry

## 2020-06-27 ENCOUNTER — Ambulatory Visit (INDEPENDENT_AMBULATORY_CARE_PROVIDER_SITE_OTHER): Payer: Medicare Other | Admitting: Podiatry

## 2020-06-27 ENCOUNTER — Ambulatory Visit (INDEPENDENT_AMBULATORY_CARE_PROVIDER_SITE_OTHER): Payer: Medicare Other

## 2020-06-27 ENCOUNTER — Other Ambulatory Visit: Payer: Self-pay

## 2020-06-27 DIAGNOSIS — M2042 Other hammer toe(s) (acquired), left foot: Secondary | ICD-10-CM | POA: Diagnosis not present

## 2020-06-27 DIAGNOSIS — M79671 Pain in right foot: Secondary | ICD-10-CM

## 2020-06-27 DIAGNOSIS — M79672 Pain in left foot: Secondary | ICD-10-CM

## 2020-06-27 DIAGNOSIS — M21619 Bunion of unspecified foot: Secondary | ICD-10-CM | POA: Diagnosis not present

## 2020-06-28 DIAGNOSIS — M5136 Other intervertebral disc degeneration, lumbar region: Secondary | ICD-10-CM | POA: Diagnosis not present

## 2020-06-28 DIAGNOSIS — M545 Low back pain, unspecified: Secondary | ICD-10-CM | POA: Diagnosis not present

## 2020-07-01 NOTE — Progress Notes (Signed)
Subjective:   Patient ID: Brad Shaw, male   DOB: 75 y.o.   MRN: [id number]   HPI Patient presents with discomfort on the left foot over the right foot stating that he has had structural deformity has hard history of bunion and lesions on the toes with digital deformity.  Patient states that time shoe gear can be difficult and he has tried different shoe gear at this time without relief   Review of Systems  All other systems reviewed and are negative.       Objective:  Physical Exam Vitals and nursing note reviewed.  Constitutional:      Appearance: He is well-developed and well-nourished.  Cardiovascular:     Pulses: Intact distal pulses.  Pulmonary:     Effort: Pulmonary effort is normal.  Musculoskeletal:        General: Normal range of motion.  Skin:    General: Skin is warm.  Neurological:     Mental Status: He is alert.     Neurovascular status intact muscle strength found to be adequate range of motion adequate.  Patient is noted to have structural deformity with bunion deformity on both feet that is painful when pressed and makes shoe gear difficult.  Has digital deformities of the lesser digits with keratotic tissue formation     Assessment:  Structural deformity with HAV hammertoe deformity with lesion formation associated with     Plan:  H&P x-rays reviewed condition discussed.  I explained the procedure risk and patient wants to try different shoes or alternatives and we reviewed shoes again more comfortable along with trimming techniques padding and cushioning.  I encouraged him to call if symptoms get worse and will have to consider surgical intervention in this case  X-ray indicates there is structural deformity left over right foot with elevation of the lesser digits

## 2020-07-02 DIAGNOSIS — M545 Low back pain, unspecified: Secondary | ICD-10-CM | POA: Diagnosis not present

## 2020-07-02 DIAGNOSIS — I10 Essential (primary) hypertension: Secondary | ICD-10-CM | POA: Diagnosis not present

## 2020-07-02 DIAGNOSIS — E7800 Pure hypercholesterolemia, unspecified: Secondary | ICD-10-CM | POA: Diagnosis not present

## 2020-07-02 DIAGNOSIS — M5136 Other intervertebral disc degeneration, lumbar region: Secondary | ICD-10-CM | POA: Diagnosis not present

## 2020-07-02 DIAGNOSIS — R413 Other amnesia: Secondary | ICD-10-CM | POA: Diagnosis not present

## 2020-07-02 DIAGNOSIS — I700 Atherosclerosis of aorta: Secondary | ICD-10-CM | POA: Diagnosis not present

## 2020-07-02 DIAGNOSIS — Z0000 Encounter for general adult medical examination without abnormal findings: Secondary | ICD-10-CM | POA: Diagnosis not present

## 2020-07-04 DIAGNOSIS — M545 Low back pain, unspecified: Secondary | ICD-10-CM | POA: Diagnosis not present

## 2020-07-04 DIAGNOSIS — M5136 Other intervertebral disc degeneration, lumbar region: Secondary | ICD-10-CM | POA: Diagnosis not present

## 2020-07-09 DIAGNOSIS — M5136 Other intervertebral disc degeneration, lumbar region: Secondary | ICD-10-CM | POA: Diagnosis not present

## 2020-07-09 DIAGNOSIS — M545 Low back pain, unspecified: Secondary | ICD-10-CM | POA: Diagnosis not present

## 2020-07-12 DIAGNOSIS — M5136 Other intervertebral disc degeneration, lumbar region: Secondary | ICD-10-CM | POA: Diagnosis not present

## 2020-07-12 DIAGNOSIS — M545 Low back pain, unspecified: Secondary | ICD-10-CM | POA: Diagnosis not present

## 2020-07-16 DIAGNOSIS — M545 Low back pain, unspecified: Secondary | ICD-10-CM | POA: Diagnosis not present

## 2020-07-16 DIAGNOSIS — M5136 Other intervertebral disc degeneration, lumbar region: Secondary | ICD-10-CM | POA: Diagnosis not present

## 2020-07-19 DIAGNOSIS — M5136 Other intervertebral disc degeneration, lumbar region: Secondary | ICD-10-CM | POA: Diagnosis not present

## 2020-07-19 DIAGNOSIS — M545 Low back pain, unspecified: Secondary | ICD-10-CM | POA: Diagnosis not present

## 2020-08-01 ENCOUNTER — Encounter: Payer: Self-pay | Admitting: Neurology

## 2020-08-02 DIAGNOSIS — C61 Malignant neoplasm of prostate: Secondary | ICD-10-CM | POA: Diagnosis not present

## 2020-08-07 DIAGNOSIS — C61 Malignant neoplasm of prostate: Secondary | ICD-10-CM | POA: Diagnosis not present

## 2020-08-07 DIAGNOSIS — N5201 Erectile dysfunction due to arterial insufficiency: Secondary | ICD-10-CM | POA: Diagnosis not present

## 2020-08-14 NOTE — Progress Notes (Unsigned)
PATIENT: Brad Shaw DOB: 1946-03-22  REASON FOR VISIT: follow up HISTORY FROM: patient  HISTORY OF PRESENT ILLNESS: Today 08/15/20  Brad Shaw is a 75 year old male with history of memory disturbance.  MRI of the brain showed mild to moderate SVD and atrophy.  He is on aspirin, Aricept, and Namenda.  He lives with his wife. Memory continues to decline, names, comes back eventually, makes second guess things. Keeps up with medications. Has chronic back issues, did PT with no benefit. Dr. Arnoldo Morale manages. Knee issues coming back. Has prostate cancer, still under surveillance. Repetitive questioning. He will manage the yard, some light repairs. Never drives only with his wife, does well when driving with wife, but she has to navigate. Lately his wife has handled the driving.  MMSE 13/30 today. They have son in [place], 1 in [place]. Planned to leave for tour of [place] in April, he doesn't want to go. Does limited wood working anymore.   Update 08/15/2019 SS:Brad Shaw is a 74 year old male with history of memory disturbance.  MRI of the brain has shown mild to moderate small vessel disease and atrophy.  He remains on aspirin and Aricept.  He lives with his wife.  He was started on Namenda at last visit.  He is tolerating medications well.  Since last seen, feels that short-term memory has declined some and has word finding difficulty.  He has a hard time remembering things he does not do often.  He may have difficulty finding things in the kitchen.  He and his wife go out with friends, he interacts well.  He sleeps well, has a good appetite.  He continues to drive, but only with his wife in the car.  He will have difficulty with directions without her.  He does his own daily activities and manages his medications.  He has had elevated PSA, is currently in surveillance.  He enjoys watching TV and doing woodworking.  They have been traveling recently to see family.  His sister recently passed away.  He thinks that  not being home, has disrupted his pattern.  He presents today for evaluation accompanied by his wife.  HISTORY 02/14/2019 SS: Brad Shaw is a 75 year old male with history of memory disturbance.  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  His last memory score was 19/30.  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  He remains on aspirin and Aricept.  He continues to report difficulty with his short-term memory.  He is able to manage his medications, but he needs help remembering his appointments.  He lives with his wife.  His wife manages their finances, which she always has.  He does drive a car without difficulty, but in the past there have been 2 incidents where he has gotten lost.  He has a good appetite.  He sleeps fair at night, but he does take over-the-counter sleep aids.  He is scheduled to have a prostate biopsy September 28 for elevated PSA.  They have a son who is a doctor who they wish discuss healthcare decisions with.  He is a retired Chief Financial Officer.  He presents today for follow-up accompanied by his wife.   REVIEW OF SYSTEMS: Out of a complete 14 system review of symptoms, the patient complains only of the following symptoms, and all other reviewed systems are negative.  Memory loss  ALLERGIES: No Known Allergies  HOME MEDICATIONS: Outpatient  Medications Prior to Visit  Medication Sig Dispense Refill  . amLODipine (NORVASC) 10 MG tablet Take 10 mg by mouth daily.    . ASPIRIN 81 PO Take by mouth daily.     Marland Kitchen atorvastatin (LIPITOR) 40 MG tablet Take 40 mg by mouth daily at 6 PM.     . cyclobenzaprine (FLEXERIL) 5 MG tablet Take 5 mg by mouth daily as needed.     . donepezil (ARICEPT) 10 MG tablet Take 1 at bedtime 90 tablet 3  . finasteride (PROSCAR) 5 MG tablet Take 5 mg by mouth daily.     Marland Kitchen lisinopril (ZESTRIL) 20 MG tablet Take 20 mg by mouth daily.    . memantine (NAMENDA) 10 MG tablet Take 1 tablet (10 mg  total) by mouth 2 (two) times daily. 180 tablet 3  . Turmeric 1053 MG TABS daily. Takes on and off    . ramipril (ALTACE) 10 MG capsule Take 10 mg by mouth 2 (two) times daily.     No facility-administered medications prior to visit.    PAST MEDICAL HISTORY: Past Medical History:  Diagnosis Date  . BPH (benign prostatic hyperplasia)   . High cholesterol   . Hypertension     PAST SURGICAL HISTORY: Past Surgical History:  Procedure Laterality Date  . TONSILLECTOMY      FAMILY HISTORY: Family History  Problem Relation Age of Onset  . Cirrhosis Mother   . Heart disease Father     SOCIAL HISTORY: Social History   Socioeconomic History  . Marital status: Married    Spouse name: Not on file  . Number of children: Not on file  . Years of education: Not on file  . Highest education level: Not on file  Occupational History  . Not on file  Tobacco Use  . Smoking status: Never Smoker  . Smokeless tobacco: Never Used  Vaping Use  . Vaping Use: Never used  Substance and Sexual Activity  . Alcohol use: Yes    Comment: 3-4 per week  . Drug use: No  . Sexual activity: Not on file  Other Topics Concern  . Not on file  Social History Narrative   Left handed    Caffeine 1/2 cups daily    Live at home with spouse    Social Determinants of Health   Financial Resource Strain: Not on file  Food Insecurity: Not on file  Transportation Needs: Not on file  Physical Activity: Not on file  Stress: Not on file  Social Connections: Not on file  Intimate Partner Violence: Not on file    PHYSICAL EXAM  Vitals:   08/15/20 1035  BP: (!) 149/74  Pulse: 69  Weight: 200 lb (90.7 kg)  Height: 6' (1.829 m)   Body mass index is 27.12 kg/m.  Generalized: Well developed, in no acute distress  MMSE - Mini Mental State Exam 08/15/2020 08/15/2019 02/14/2019  Orientation to time 0 1 4  Orientation to Place 2 2 3   Registration 3 2 3   Attention/ Calculation 0 2 1  Recall 0 0 1   Language- name 2 objects 2 2 2   Language- repeat 1 1 1   Language- follow 3 step command 2 3 3   Language- read & follow direction 1 1 1   Write a sentence 1 1 1   Copy design 1 1 1   Copy design-comments - - named 5 animals  Total score 13 16 21     Neurological examination  Mentation: Alert oriented to time, place, history  taking. Follows all commands speech and language fluent, well dressed, well-appearing Cranial nerve II-XII: Pupils were equal round reactive to light. Extraocular movements were full, visual field were full on confrontational test. Facial sensation and strength were normal. Head turning and shoulder shrug were normal and symmetric. Motor: The motor testing reveals 5 over 5 strength of all 4 extremities. Good symmetric motor tone is noted throughout.  Sensory: Sensory testing is intact to soft touch on all 4 extremities. No evidence of extinction is noted.  Coordination: Cerebellar testing reveals good finger-nose-finger and heel-to-shin bilaterally.  Gait and station: Gait is normal. Tandem gait is slightly unsteady. Reflexes: Deep tendon reflexes are symmetric   DIAGNOSTIC DATA (LABS, IMAGING, TESTING) - I reviewed patient records, labs, notes, testing and imaging myself where available.  No results found for: WBC, HGB, HCT, MCV, PLT No results found for: NA, K, CL, CO2, GLUCOSE, BUN, CREATININE, CALCIUM, PROT, ALBUMIN, AST, ALT, ALKPHOS, BILITOT, GFRNONAA, GFRAA No results found for: CHOL, HDL, LDLCALC, LDLDIRECT, TRIG, CHOLHDL No results found for: HGBA1C No results found for: VITAMINB12 No results found for: TSH    ASSESSMENT AND PLAN 75 y.o. year old male  has a past medical history of BPH (benign prostatic hyperplasia), High cholesterol, and Hypertension. here with:  1.  Memory disturbance 2.  Small vessel disease by MRI, history of hypertension  -Continued gradual decline in MMSE, 13/30 today, seems to function better than score may suggest -At this  point, would suggest he refrain from driving, he is agreeable, but if becomes issue, consider driving evaluation  -Continue Aricept and Namenda; aspirin 81 mg daily -Encouraged to continue activity, exercise -Continue routine follow-up with PCP, follow-up in our office in 1 year or sooner if needed  I spent 30 minutes of face-to-face and non-face-to-face time with patient.  This included previsit chart review, lab review, study review, order entry, electronic health record documentation, patient education.  Butler Denmark, AGNP-C, DNP 08/15/2020, 10:49 AM [facility] [address] [place], [place] [address] [phone]

## 2020-08-15 ENCOUNTER — Encounter: Payer: Self-pay | Admitting: Neurology

## 2020-08-15 ENCOUNTER — Ambulatory Visit (INDEPENDENT_AMBULATORY_CARE_PROVIDER_SITE_OTHER): Payer: Medicare Other | Admitting: Neurology

## 2020-08-15 VITALS — BP 149/74 | HR 69 | Ht 72.0 in | Wt 200.0 lb

## 2020-08-15 DIAGNOSIS — R413 Other amnesia: Secondary | ICD-10-CM | POA: Diagnosis not present

## 2020-08-15 MED ORDER — DONEPEZIL HCL 10 MG PO TABS: ORAL_TABLET | ORAL | 3 refills | Status: DC

## 2020-08-15 MED ORDER — MEMANTINE HCL 10 MG PO TABS: 10.0000 mg | ORAL_TABLET | Freq: Two times a day (BID) | ORAL | 3 refills | Status: DC

## 2020-08-15 NOTE — Progress Notes (Signed)
I have read the note, and I agree with the clinical assessment and plan.  Suhaas Agena K Helton Oleson   

## 2020-08-15 NOTE — Patient Instructions (Addendum)
I would recommend against driving at this point  Continue current medications Recommend brain stimulating exercises, regular exercise  See you back in 1 year

## 2020-09-12 DIAGNOSIS — Z23 Encounter for immunization: Secondary | ICD-10-CM | POA: Diagnosis not present

## 2020-10-25 ENCOUNTER — Encounter: Payer: Self-pay | Admitting: Neurology

## 2020-11-26 DIAGNOSIS — I10 Essential (primary) hypertension: Secondary | ICD-10-CM | POA: Diagnosis not present

## 2020-11-26 DIAGNOSIS — C61 Malignant neoplasm of prostate: Secondary | ICD-10-CM | POA: Diagnosis not present

## 2020-11-26 DIAGNOSIS — F0390 Unspecified dementia without behavioral disturbance: Secondary | ICD-10-CM | POA: Diagnosis not present

## 2020-11-26 DIAGNOSIS — E7800 Pure hypercholesterolemia, unspecified: Secondary | ICD-10-CM | POA: Diagnosis not present

## 2020-11-26 DIAGNOSIS — I700 Atherosclerosis of aorta: Secondary | ICD-10-CM | POA: Diagnosis not present

## 2021-01-07 DIAGNOSIS — H2513 Age-related nuclear cataract, bilateral: Secondary | ICD-10-CM | POA: Diagnosis not present

## 2021-01-12 ENCOUNTER — Encounter: Payer: Self-pay | Admitting: Neurology

## 2021-01-14 ENCOUNTER — Encounter: Payer: Self-pay | Admitting: Neurology

## 2021-01-15 ENCOUNTER — Telehealth: Payer: Self-pay | Admitting: Neurology

## 2021-01-15 NOTE — Telephone Encounter (Signed)
Dr. Jannifer Franklin had a cancellation on 01/17/21 at 4pm. The patient has been scheduled in this slot. He will check-in at 3:30pm.

## 2021-01-15 NOTE — Telephone Encounter (Signed)
Pt's wife called she is wanting husband to be seen sooner than March. She says he is getting worse and really wants husband to see Dr. Jannifer Franklin. Pt's wife requesting a call back.

## 2021-01-17 ENCOUNTER — Ambulatory Visit (INDEPENDENT_AMBULATORY_CARE_PROVIDER_SITE_OTHER): Payer: Medicare Other | Admitting: Neurology

## 2021-01-17 ENCOUNTER — Encounter: Payer: Self-pay | Admitting: Neurology

## 2021-01-17 DIAGNOSIS — G309 Alzheimer's disease, unspecified: Secondary | ICD-10-CM | POA: Diagnosis not present

## 2021-01-17 DIAGNOSIS — F0280 Dementia in other diseases classified elsewhere without behavioral disturbance: Secondary | ICD-10-CM | POA: Diagnosis not present

## 2021-01-17 HISTORY — DX: Dementia in other diseases classified elsewhere, unspecified severity, without behavioral disturbance, psychotic disturbance, mood disturbance, and anxiety: F02.80

## 2021-01-17 NOTE — Progress Notes (Signed)
Reason for visit: Dementia  Brad Shaw is an 75 y.o. male  History of present illness:  Mr. Brad Shaw is a 75 year old left-handed white male with a history of progressive memory disorder consistent with Alzheimer's disease.  The patient comes in with his wife today.  She has noted worsening cognitive deficits.  He is having increasing problems with short-term memory, he will not remember recent events.  He has some troubles with remembering names for people.  He is becoming more withdrawn, he is more easily confused when there is a lot of external stimuli or if he is traveling.  The patient is having frequent episodes of significant fatigue in the morning, he will get up and eat breakfast and then sleep into early afternoon.  He does not snore at night.  He is no longer operating a motor vehicle.  He is on Aricept and Namenda, he seems to tolerate these medications well.  The patient denies any vivid dreams on the Aricept.  The patient otherwise is eating well, no weight loss is noted.  Past Medical History:  Diagnosis Date   BPH (benign prostatic hyperplasia)    High cholesterol    Hypertension     Past Surgical History:  Procedure Laterality Date   TONSILLECTOMY      Family History  Problem Relation Age of Onset   Cirrhosis Mother    Heart disease Father     Social history:  reports that he has never smoked. He has never used smokeless tobacco. He reports current alcohol use. He reports that he does not use drugs.   No Known Allergies  Medications:  Prior to Admission medications   Medication Sig Start Date End Date Taking? Authorizing Provider  amLODipine (NORVASC) 10 MG tablet Take 10 mg by mouth daily. 07/15/18  Yes [provider]  ASPIRIN 81 PO Take by mouth daily.    Yes [provider]  atorvastatin (LIPITOR) 40 MG tablet Take 40 mg by mouth daily at 6 PM.  06/25/18  Yes [provider]  cyclobenzaprine (FLEXERIL) 5 MG tablet Take 5 mg by  mouth daily as needed.  05/10/18  Yes [provider]  donepezil (ARICEPT) 10 MG tablet Take 1 at bedtime 08/15/20  Yes Suzzanne Cloud, NP  finasteride (PROSCAR) 5 MG tablet Take 5 mg by mouth daily.  06/16/18  Yes [provider]  lisinopril (ZESTRIL) 20 MG tablet Take 20 mg by mouth daily. 02/07/20  Yes [provider]  memantine (NAMENDA) 10 MG tablet Take 1 tablet (10 mg total) by mouth 2 (two) times daily. 08/15/20  Yes Suzzanne Cloud, NP  Turmeric 1053 MG TABS daily. Takes on and off   Yes [provider]    ROS:  Out of a complete 14 system review of symptoms, the patient complains only of the following symptoms, and all other reviewed systems are negative.  Memory difficulty Fatigue, daytime drowsiness  Blood pressure 135/78, pulse 66, height 6' (1.829 m), weight 200 lb (90.7 kg).  Physical Exam  General: The patient is alert and cooperative at the time of the examination.  The patient is moderately obese.  Skin: No significant peripheral edema is noted.   Neurologic Exam  Mental status: The patient is alert and oriented x 3 at the time of the examination.  The Mini-Mental status examination done today shows a total score of 17/30.   Cranial nerves: Facial symmetry is present. Speech is normal, no aphasia or dysarthria is noted.  Extraocular movements are full. Visual fields are full.  Motor: The patient has good strength in all 4 extremities.  Sensory examination: Soft touch sensation is symmetric on the face, arms, and legs.  Coordination: The patient has good finger-nose-finger and heel-to-shin bilaterally.  No significant apraxia was seen on clinical examination.  Gait and station: The patient has a normal gait. Tandem gait is normal. Romberg is negative. No drift is seen.  Reflexes: Deep tendon reflexes are symmetric.   MRI brain 10/30/18:  IMPRESSION: This MRI of the brain without contrast shows the following: 1.   Moderate  generalized cortical atrophy that is most pronounced in the mesial temporal lobes and mild cerebellar and corpus callosal atrophy. 2.   T2/flair hyperintense foci most consistent with mild chronic microvascular ischemic changes. 3.   There are no acute findings.   * MRI scan images were reviewed online. I agree with the written report.    Assessment/Plan:  1.  Progressive memory disturbance, Alzheimer's disease  The patient continues to progress slowly with his memory issues.  He is on Aricept and Namenda.  His wife helps him with managing medications, she does the finances, and keeps up with appointments.  He is no longer driving.  They will continue the Aricept and Namenda, they will follow-up here in 6 months.  In the future, the patient be followed through Dr. Leta Baptist.  Jill Alexanders MD 01/17/2021 4:19 PM  [facility] [address] [place], [place] [address]  Phone [phone] Fax [phone]

## 2021-01-21 ENCOUNTER — Other Ambulatory Visit: Payer: Self-pay | Admitting: Neurology

## 2021-01-21 DIAGNOSIS — G479 Sleep disorder, unspecified: Secondary | ICD-10-CM

## 2021-02-14 DIAGNOSIS — C61 Malignant neoplasm of prostate: Secondary | ICD-10-CM | POA: Diagnosis not present

## 2021-02-19 DIAGNOSIS — Z23 Encounter for immunization: Secondary | ICD-10-CM | POA: Diagnosis not present

## 2021-02-28 DIAGNOSIS — C61 Malignant neoplasm of prostate: Secondary | ICD-10-CM | POA: Diagnosis not present

## 2021-02-28 DIAGNOSIS — N5201 Erectile dysfunction due to arterial insufficiency: Secondary | ICD-10-CM | POA: Diagnosis not present

## 2021-03-05 ENCOUNTER — Other Ambulatory Visit: Payer: Self-pay | Admitting: Urology

## 2021-03-05 ENCOUNTER — Other Ambulatory Visit (HOSPITAL_COMMUNITY): Payer: Self-pay | Admitting: Urology

## 2021-03-05 ENCOUNTER — Telehealth: Payer: Self-pay | Admitting: Radiation Oncology

## 2021-03-05 DIAGNOSIS — C61 Malignant neoplasm of prostate: Secondary | ICD-10-CM

## 2021-03-05 NOTE — Telephone Encounter (Signed)
Called patient to schedule consultation with Dr. Tammi Klippel. No answer, LVM for return call.

## 2021-03-14 DIAGNOSIS — M25561 Pain in right knee: Secondary | ICD-10-CM | POA: Diagnosis not present

## 2021-03-18 ENCOUNTER — Encounter (HOSPITAL_COMMUNITY)
Admission: RE | Admit: 2021-03-18 | Discharge: 2021-03-18 | Disposition: A | Discharge status: Home / Self Care | Payer: Medicare Other | Source: Ambulatory Visit | Attending: Urology | Admitting: Urology

## 2021-03-18 ENCOUNTER — Other Ambulatory Visit (HOSPITAL_COMMUNITY): Payer: Medicare Other

## 2021-03-18 ENCOUNTER — Ambulatory Visit (HOSPITAL_COMMUNITY): Payer: Medicare Other

## 2021-03-18 DIAGNOSIS — C61 Malignant neoplasm of prostate: Secondary | ICD-10-CM | POA: Diagnosis not present

## 2021-03-18 DIAGNOSIS — M19012 Primary osteoarthritis, left shoulder: Secondary | ICD-10-CM | POA: Diagnosis not present

## 2021-03-18 DIAGNOSIS — M19011 Primary osteoarthritis, right shoulder: Secondary | ICD-10-CM | POA: Diagnosis not present

## 2021-03-18 MED ORDER — TECHNETIUM TC 99M MEDRONATE IV KIT
20.0000 | PACK | Freq: Once | INTRAVENOUS | Status: AC | PRN
  Administered 2021-03-18: 20.4 via INTRAVENOUS

## 2021-03-19 ENCOUNTER — Ambulatory Visit
Admission: RE | Admit: 2021-03-19 | Discharge: 2021-03-19 | Disposition: A | Discharge status: Home / Self Care | Payer: Medicare Other | Source: Ambulatory Visit | Attending: Radiation Oncology | Admitting: Radiation Oncology

## 2021-03-19 ENCOUNTER — Encounter: Payer: Self-pay | Admitting: Radiation Oncology

## 2021-03-19 DIAGNOSIS — C61 Malignant neoplasm of prostate: Secondary | ICD-10-CM | POA: Insufficient documentation

## 2021-03-19 DIAGNOSIS — R972 Elevated prostate specific antigen [PSA]: Secondary | ICD-10-CM | POA: Diagnosis not present

## 2021-03-19 HISTORY — DX: Malignant neoplasm of prostate: C61

## 2021-03-19 NOTE — Progress Notes (Signed)
GU Location of Tumor / Histology: Prostate Ca  If Prostate Cancer, Gleason Score is (3 + 3) and PSA is (19.9 as of 3/22)  Biopsies  Dr. Tresa Moore        Past/Anticipated interventions by urology, if any:   Past/Anticipated interventions by medical oncology, if any:   Weight changes, if any: No  IPSS:4 SHIM:  10  Bowel/Bladder complaints, if any:  No bowel or bladder issues.  Nausea/Vomiting, if any: No  Pain issues, if any:  Right knee  SAFETY ISSUES: Prior radiation? No Pacemaker/ICD? No Possible current pregnancy?  Male Is the patient on methotrexate? No  Current Complaints / other details:  More information on treatment.

## 2021-03-19 NOTE — Progress Notes (Signed)
Radiation Oncology         [phone] ________________________________  Initial Outpatient Consultation - Conducted via MyChart due to current COVID-19 concerns for limiting patient exposure  Name: Brad Shaw MRN: [id number]  Date: 03/19/2021  DOB: 25-Sep-1945  XB:[id number], Annie Main, MD  Alexis Frock, MD   REFERRING PHYSICIAN: Alexis Frock, MD  DIAGNOSIS: 75 y.o. gentleman with Stage T1c adenocarcinoma of the prostate with Gleason score of 4+5, and PSA of 40 (adjusted for finasteride).    ICD-10-CM   1. Malignant neoplasm of prostate ([place])  C61       HISTORY OF PRESENT ILLNESS: Brad Shaw is a 75 y.o. male with a diagnosis of prostate cancer. He has been followed by Dr. Tresa Moore since at least 2016 for an elevated PSA. His initial biopsy on 08/17/14 showed only HG/PIN and atypia; PSA at that time was 6.02. He continued under surveillance with a prostate MRI performed on 01/06/17 that was benign. He was placed on finasteride early on in his surveillance. His PSA continued to rise and reached 11.7 (23.4 adjusted for finasteride) in 12/2018. He underwent surveillance biopsy on 02/28/19 and was found to have one core with Gleason 3+4. After discussing treatment options, he opted to proceed with active surveillance.   His PSA increased further to 18.9 (37.8 adjusted) in 03/2020. This prompted a repeat prostate MRI which was performed on 04/03/20 and showed an 11 mm PI-RADS 4 lesion in the anterior mid apical transitional zone without evidence of extracapsular extension, seminal vesicle invasion, or neurovascular bundle involvement. Given a size of less than 2 cm and the patient's neurologic comorbidity (which has now been diagnosed as Alzheimer's), the patient was most comfortable continuing with active surveillance on daily finasteride. His PSA was 19.8 (39.6 adjusted) in 07/2020 so the decision was to repeat a biopsy in 6 months.  The patient proceeded to transrectal ultrasound with 12 biopsies  of the prostate on 02/14/21.  The prostate volume measured 43.8 cc.  Out of 12 core biopsies, only 1 was positive, the same core that was previously positive on biopsy in 02/2019.  This time, however, the maximum Gleason score was 4+5, and this was seen in the left apex.  He underwent staging bone scan on 03/18/21, showing no findings for metastatic bone disease. He has not had any imaging of the abdomen, only MRI pelvis.  The patient reviewed the biopsy results with his urologist and he has kindly been referred today for discussion of potential radiation treatment options.   PREVIOUS RADIATION THERAPY: No  PAST MEDICAL HISTORY:  Past Medical History:  Diagnosis Date   Alzheimer disease ([place]) 01/17/2021   BPH (benign prostatic hyperplasia)    High cholesterol    Hypertension    Prostate cancer ([place])       PAST SURGICAL HISTORY: Past Surgical History:  Procedure Laterality Date   TONSILLECTOMY      FAMILY HISTORY:  Family History  Problem Relation Age of Onset   Cirrhosis Mother    Heart disease Father     SOCIAL HISTORY:  Social History   Socioeconomic History   Marital status: Married    Spouse name: Not on file   Number of children: Not on file   Years of education: Not on file   Highest education level: Not on file  Occupational History   Not on file  Tobacco Use   Smoking status: Never   Smokeless tobacco: Never  Vaping Use   Vaping Use: Never used  Substance and  Sexual Activity   Alcohol use: Yes    Comment: 3-4 per week   Drug use: No   Sexual activity: Not Currently  Other Topics Concern   Not on file  Social History Narrative   Left handed    Caffeine 1/2 cups daily    Live at home with spouse    Social Determinants of Health   Financial Resource Strain: Not on file  Food Insecurity: Not on file  Transportation Needs: Not on file  Physical Activity: Not on file  Stress: Not on file  Social Connections: Not on file  Intimate Partner Violence:  Not on file    ALLERGIES: Patient has no known allergies.  MEDICATIONS:  Current Outpatient Medications  Medication Sig Dispense Refill   Cholecalciferol (VITAMIN D3) 50 MCG (2000 UT) TABS Take 50 mcg by mouth daily.     amLODipine (NORVASC) 10 MG tablet Take 10 mg by mouth daily.     ASPIRIN 81 PO Take by mouth daily.      atorvastatin (LIPITOR) 40 MG tablet Take 40 mg by mouth daily at 6 PM.      celecoxib (CELEBREX) 200 MG capsule celecoxib 200 mg capsule  Take one capsule daily as needed for pain and inflammation.     cyclobenzaprine (FLEXERIL) 5 MG tablet Take 5 mg by mouth daily as needed.      donepezil (ARICEPT) 10 MG tablet Take 1 at bedtime 90 tablet 3   finasteride (PROSCAR) 5 MG tablet Take 5 mg by mouth daily.      lisinopril (ZESTRIL) 20 MG tablet Take 20 mg by mouth daily.     memantine (NAMENDA) 10 MG tablet Take 1 tablet (10 mg total) by mouth 2 (two) times daily. 180 tablet 3   Turmeric 1053 MG TABS daily. Takes on and off     No current facility-administered medications for this encounter.    REVIEW OF SYSTEMS:  On review of systems, the patient reports that he is doing well overall. He denies any chest pain, shortness of breath, cough, fevers, chills, night sweats, unintended weight changes. He denies any bowel disturbances, and denies abdominal pain, nausea or vomiting. He denies any new musculoskeletal or joint aches or pains. His IPSS was 4, indicating mild urinary symptoms. 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 A complete review of systems is obtained and is otherwise negative.    PHYSICAL EXAM:  Wt Readings from Last 3 Encounters:  01/17/21 200 lb (90.7 kg)  08/15/20 200 lb (90.7 kg)  08/15/19 200 lb 3.2 oz (90.8 kg)   Temp Readings from Last 3 Encounters:  08/15/19 97.8 F (36.6 C)  02/14/19 98 F (36.7 C)  07/18/13 98.9 F (37.2 C) (Oral)   BP Readings from Last 3 Encounters:  01/17/21 135/78  08/15/20 (!)  149/74  08/15/19 (!) 141/68   Pulse Readings from Last 3 Encounters:  01/17/21 66  08/15/20 69  08/15/19 83   Pain Assessment Pain Score: 4  Pain Loc: Knee (right)/10  In general this is a well appearing Caucasian male in no acute distress. He's alert and oriented x4 and appropriate throughout the examination. Cardiopulmonary assessment is negative for acute distress and he exhibits normal effort.    KPS = 90 (related to his dementia, not prostate cancer)  100 - Normal; no complaints; no evidence of disease. 90   - Able to carry on normal activity; minor signs or symptoms of disease. 80   -  Normal activity with effort; some signs or symptoms of disease. 25   - Cares for self; unable to carry on normal activity or to do active work. 60   - Requires occasional assistance, but is able to care for most of his personal needs. 50   - Requires considerable assistance and frequent medical care. 39   - Disabled; requires special care and assistance. 65   - Severely disabled; hospital admission is indicated although death not imminent. 5   - Very sick; hospital admission necessary; active supportive treatment necessary. 10   - Moribund; fatal processes progressing rapidly. 0     - Dead  Karnofsky DA, Abelmann WH, Craver LS and Burchenal JH [phone]) The use of the nitrogen mustards in the palliative treatment of carcinoma: with particular reference to bronchogenic carcinoma Cancer 1 634-56  LABORATORY DATA:  No results found for: WBC, HGB, HCT, MCV, PLT No results found for: NA, K, CL, CO2 No results found for: ALT, AST, GGT, ALKPHOS, BILITOT   RADIOGRAPHY: No results found.    IMPRESSION/PLAN: This visit was conducted via MyChart to spare the patient unnecessary potential exposure in the healthcare setting during the current COVID-19 pandemic. 1. 75 y.o. gentleman with Stage T1c adenocarcinoma of the prostate with Gleason score of 4+5, and PSA of 40 (adjusted for finasteride).  We  discussed the patient's workup and outlined the nature of prostate cancer in this setting. The patient's T stage, Gleason's score, and PSA put him into the high risk group. Given his PSA well above 20 and Gleason 4+5 disease, we have recommended  PSMA PET to complete his disease staging.  Pending this study confirms localized disease only, he is eligible for a variety of potential treatment options including prostatectomy, or LT-ADT in combination with either 8 weeks of external radiation or 5 weeks of external radiation preceded by a brachytherapy boost. He is not felt to be an ideal surgical candidate based on his advanced age and other medical comorbidities.  We discussed the available radiation techniques, and focused on the details and logistics of delivery. We discussed and outlined the risks, benefits, short and long-term effects associated with radiotherapy and compared and contrasted these with prostatectomy. We discussed the role of SpaceOAR in reducing the rectal toxicity associated with radiotherapy. We also detailed the role of ADT in the treatment of high risk prostate cancer and outlined the associated side effects that could be expected with this therapy. He and his wife were encouraged to ask questions that were answered to their stated satisfaction.  At the end of the conversation the patient is interested in moving forward with PSMA PET imaging to complete his disease staging and pending this scan confirms localized disease, he is most interested in proceeding with LT-ADT concurrent with external beam radiotherapy. We discussed the possibility of using a STAMPEDE style, hypo-fractionated approach with 20 fxs of IMRT concurrent with ADT but did indicate that this approach does tend to lead to more pronounced side effects than the traditionally fractionated IMRT in 40 fxs. They will give this some consideration and let us know how they would like to proceed when we connect by phone to review his  PET scan results once they are available. They appear to have a good understanding of his disease and our treatment recommendations which are of curative intent and are in agreement with the stated plan so we will proceed with scheduling the PSMA PET scan, first available and coordinate for start of ADT thereafter (preferable  same day since it is somewhat difficult for his wife to get him to and from medical visits). We will share our discussion with Dr. Tresa Moore and proceed accordingly.  Given current concerns for patient exposure during the COVID-19 pandemic, this encounter was conducted via video-enabled MyChart visit. The patient has given verbal consent for this type of encounter. The attendants for this meeting include Tyler Pita MD, Ashlyn Bruning PA-C, and patient, Brad Shaw and wife, Jeani Hawking. During the encounter, Tyler Pita MD and Freeman Caldron PA-C were located at [facility] Radiation Oncology Department.  Patient, Brad Shaw and his wife, Jeani Hawking were located at home.  We personally spent 75 minutes in this encounter including chart review, reviewing radiological studies, meeting face-to-face with the patient, entering orders and completing documentation.'   Nicholos Johns, PA-C    Tyler Pita, MD  [place]: [phone]  Fax: [phone] [email or web address]  Skype  LinkedIn   This document serves as a record of services personally performed by Tyler Pita, MD and Freeman Caldron, PA-C. It was created on their behalf by Wilburn Mylar, a trained medical scribe. The creation of this record is based on the scribe's personal observations and the provider's statements to them. This document has been checked and approved by the attending provider.

## 2021-03-22 ENCOUNTER — Telehealth: Payer: Self-pay | Admitting: *Deleted

## 2021-03-22 NOTE — Telephone Encounter (Signed)
CALLED PATIENT TO INFORM OF PSMA SCAN ON 03-25-21- ARRIVAL TIME- 1:00 PM @ [facility], PATIENT HAD AN APPT. WITH [facility] ON 03-26-21 - ARRIVAL TIME- 9 AM @ [facility], PATIENT TO CANCEL ADT AT THIS TIME, PATIENT AND HIS WIFE VERIFIED UNDERSTANDING THIS SCAN APPT.

## 2021-03-25 ENCOUNTER — Encounter (HOSPITAL_COMMUNITY)
Admission: RE | Admit: 2021-03-25 | Discharge: 2021-03-25 | Disposition: A | Discharge status: Home / Self Care | Payer: Medicare Other | Source: Ambulatory Visit | Attending: Urology | Admitting: Urology

## 2021-03-25 DIAGNOSIS — C61 Malignant neoplasm of prostate: Secondary | ICD-10-CM | POA: Diagnosis not present

## 2021-03-25 DIAGNOSIS — R918 Other nonspecific abnormal finding of lung field: Secondary | ICD-10-CM | POA: Diagnosis not present

## 2021-03-25 MED ORDER — GALLIUM GA 68 GOZETOTIDE 25 MCG IV KIT
5.1000 | PACK | Freq: Once | INTRAVENOUS | Status: AC
  Administered 2021-03-25: 5.1 via INTRAVENOUS

## 2021-03-26 ENCOUNTER — Telehealth: Payer: Self-pay

## 2021-03-26 NOTE — Telephone Encounter (Signed)
Brad Shaw wanted to inform the radiation team that she will be out of town 03/30/2021-04/06/2021.  If there's anything that she needs to address in reference to Brad Shaw's treatment to contact her this week before she leaves.  Her husband has Dementia and she's his caretaker.  Her son Brad Shaw will be with her husband and his number [phone] and he will be able to reach her.

## 2021-03-29 ENCOUNTER — Telehealth: Payer: Self-pay | Admitting: *Deleted

## 2021-03-29 NOTE — Telephone Encounter (Signed)
CALLED PATIENT'S WIFE- LYNN TO INFORM OF ADT APPT. ON 04-30-21- ARRIVAL TIME- 12:45 PM @ DR. MANNY'S OFFICE, PATIENT'S WIFE- LYNN VERIFIED UNDERSTANDING THIS APPT.

## 2021-03-29 NOTE — Progress Notes (Signed)
Introduced myself to patient and his wife as the prostate nurse navigator and discussed my role during the virtual consult with Ashlyn Bruning, PA-C and Dr. Tammi Klippel.  Pt with history of dementia, wife request that appointments be coordinated through her due to dementia.  Pt and wife to discuss treatment options, with decision to proceed with PSMA scan prior to making treatment decisions.

## 2021-03-29 NOTE — Telephone Encounter (Signed)
Called patient to ask if he is ready for me to schedule his ADT, lvm for a return call

## 2021-04-09 NOTE — Progress Notes (Signed)
RN spoke with patients wife, Brad Shaw, to follow up regarding ADT therapy.    Patient with stage T1c adenocarcinoma of the prostate with Gleason score of 4+5, putting him into the high risk group.  Patient with history of dementia as well.  Patient and his wife had consult on 10/18 with recommendations of PSMA pet scan to complete staging and then proceeding with LT-ADT with radiation.   Wife reports they have not started ADT therapy, and has not started Rx Bicalutamide.  Wife verbalized that they have chosen not to proceed with radiation due to patients severe dementia.  RN inquired if they would be interested in still pursuing ADT alone.  Wife verbalized that she thought this was only used with radiation.   RN provided education with ADT slowing progression of cancer, not curative. Educated that Bicalutamide will help reduce the side effects of the ADT injection (intensity of hot flashes, fatigue) and is only needed for about a month and then they stop that and just continue the ADT. Brad Shaw is interested in moving forward with ADT however would like to speak with Dr. Tresa Moore prior to start.    RN sent message to Dr. Tresa Moore to update on change in treatment plan.  Pt is currently scheduled for ADT on 11/10; will follow up Thursday if this needs to be changed.  Wife verbalized she will pick up Bicalutamide.  Contact number given, wife understands she can call for any additional questions.

## 2021-04-10 ENCOUNTER — Telehealth: Payer: Self-pay | Admitting: *Deleted

## 2021-04-10 ENCOUNTER — Institutional Professional Consult (permissible substitution): Payer: Medicare Other | Admitting: Neurology

## 2021-04-10 NOTE — Telephone Encounter (Signed)
RETURNED PATIENT'S WIFE'S PHONE CALL, SPOKE WITH MS. Ebbs

## 2021-04-11 DIAGNOSIS — C61 Malignant neoplasm of prostate: Secondary | ICD-10-CM | POA: Diagnosis not present

## 2021-04-11 DIAGNOSIS — N5201 Erectile dysfunction due to arterial insufficiency: Secondary | ICD-10-CM | POA: Diagnosis not present

## 2021-04-12 NOTE — Progress Notes (Signed)
Pt followed up to Dr. Zettie Pho office on 11/10 and has decided to proceed with ADT alone.  Ashlyn, PA-C and Dr. Tammi Klippel notified of decision.

## 2021-04-30 ENCOUNTER — Emergency Department (HOSPITAL_BASED_OUTPATIENT_CLINIC_OR_DEPARTMENT_OTHER): Payer: Medicare Other

## 2021-04-30 ENCOUNTER — Encounter (HOSPITAL_BASED_OUTPATIENT_CLINIC_OR_DEPARTMENT_OTHER): Payer: Self-pay | Admitting: *Deleted

## 2021-04-30 ENCOUNTER — Other Ambulatory Visit: Payer: Self-pay

## 2021-04-30 ENCOUNTER — Emergency Department (HOSPITAL_BASED_OUTPATIENT_CLINIC_OR_DEPARTMENT_OTHER)
Admission: EM | Admit: 2021-04-30 | Discharge: 2021-04-30 | Disposition: A | Discharge status: Home / Self Care | Payer: Medicare Other | Attending: Emergency Medicine | Admitting: Emergency Medicine

## 2021-04-30 DIAGNOSIS — M25511 Pain in right shoulder: Secondary | ICD-10-CM | POA: Diagnosis not present

## 2021-04-30 DIAGNOSIS — Z8546 Personal history of malignant neoplasm of prostate: Secondary | ICD-10-CM | POA: Insufficient documentation

## 2021-04-30 DIAGNOSIS — M25521 Pain in right elbow: Secondary | ICD-10-CM | POA: Insufficient documentation

## 2021-04-30 DIAGNOSIS — Z7982 Long term (current) use of aspirin: Secondary | ICD-10-CM | POA: Insufficient documentation

## 2021-04-30 DIAGNOSIS — Z79899 Other long term (current) drug therapy: Secondary | ICD-10-CM | POA: Diagnosis not present

## 2021-04-30 DIAGNOSIS — G309 Alzheimer's disease, unspecified: Secondary | ICD-10-CM | POA: Insufficient documentation

## 2021-04-30 DIAGNOSIS — W19XXXA Unspecified fall, initial encounter: Secondary | ICD-10-CM | POA: Diagnosis not present

## 2021-04-30 DIAGNOSIS — S34109A Unspecified injury to unspecified level of lumbar spinal cord, initial encounter: Secondary | ICD-10-CM | POA: Diagnosis present

## 2021-04-30 DIAGNOSIS — S4351XA Sprain of right acromioclavicular joint, initial encounter: Secondary | ICD-10-CM | POA: Diagnosis not present

## 2021-04-30 DIAGNOSIS — S32010A Wedge compression fracture of first lumbar vertebra, initial encounter for closed fracture: Secondary | ICD-10-CM | POA: Insufficient documentation

## 2021-04-30 DIAGNOSIS — I10 Essential (primary) hypertension: Secondary | ICD-10-CM | POA: Insufficient documentation

## 2021-04-30 DIAGNOSIS — M545 Low back pain, unspecified: Secondary | ICD-10-CM | POA: Diagnosis not present

## 2021-04-30 NOTE — Discharge Instructions (Signed)
Follow-up with Dr. Arnoldo Morale about your lumbar injury.  Follow-up with primary care doctor Dr. Raeford Razor with sports medicine about your right shoulder pain.  Wear your sling for comfort.  It is okay to remove this at times.  Recommend Tylenol, Flexeril for pain.

## 2021-04-30 NOTE — ED Triage Notes (Signed)
He fell last night. Injury to his lower back, right elbow and shoulder. He is ambulatory.

## 2021-04-30 NOTE — ED Provider Notes (Signed)
[facility] EMERGENCY DEPARTMENT Provider Note   CSN: [id number] Arrival date & time: 04/30/21  8242     History Chief Complaint  Patient presents with   Brad Shaw    Brad Shaw is a 75 y.o. male.  Patient with mechanical fall yesterday.  Pain to his low back, right elbow, right shoulder.  Has been able to ambulate without much discomfort.  Did not hit his head or lose consciousness.  He is not on blood thinners.  The history is provided by the patient.  Fall This is a new problem. The current episode started yesterday. The problem has been resolved. Pertinent negatives include no chest pain, no abdominal pain, no headaches and no shortness of breath. The symptoms are aggravated by walking. The symptoms are relieved by rest. He has tried nothing for the symptoms. The treatment provided no relief.      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 Authorizing Provider  amLODipine (NORVASC) 10 MG tablet Take 10 mg by mouth daily. 07/15/18   [provider]  ASPIRIN 81 PO Take by mouth daily.     [provider]  atorvastatin (LIPITOR) 40 MG tablet Take 40 mg by mouth daily at 6 PM.  06/25/18   [provider]  celecoxib (CELEBREX) 200 MG capsule celecoxib 200 mg capsule  Take one capsule daily as needed for pain and inflammation.    [provider]  Cholecalciferol (VITAMIN D3) 50 MCG (2000 UT) TABS Take 50 mcg by mouth daily.    [provider]  cyclobenzaprine (FLEXERIL) 5 MG tablet Take 5 mg by mouth daily as needed.  05/10/18   [provider]  donepezil (ARICEPT) 10 MG tablet Take 1 at bedtime 08/15/20   Suzzanne Cloud, NP  finasteride (PROSCAR) 5 MG tablet Take 5 mg by mouth daily.  06/16/18   [provider]  lisinopril (ZESTRIL) 20 MG tablet Take 20 mg by mouth daily. 02/07/20   [provider]  memantine (NAMENDA) 10 MG tablet Take 1 tablet (10 mg total) by mouth 2 (two) times daily. 08/15/20   Suzzanne Cloud, NP  Turmeric 1053 MG TABS daily. Takes on and off    [provider]    Allergies    Patient has no known allergies.  Review of Systems   Review of Systems  Constitutional:  Negative for chills and fever.  HENT:  Negative for ear pain and sore throat.   Eyes:  Negative for pain and visual disturbance.  Respiratory:  Negative for cough and shortness of breath.   Cardiovascular:  Negative for chest pain and palpitations.  Gastrointestinal:  Negative for abdominal pain and vomiting.  Genitourinary:  Negative for dysuria and hematuria.  Musculoskeletal:  Positive for arthralgias and back pain. Negative for gait problem, joint swelling and neck pain.  Skin:  Negative for color change and rash.  Neurological:  Negative for seizures, syncope and headaches.  All other systems reviewed and are negative.  Physical Exam Updated Vital Signs BP (!) 150/77 (BP Location: Right Arm)   Pulse (!) 118   Temp 99.1 F (37.3 C) (Oral)   Resp 18   Ht 6' (1.829 m)   Wt  90.7 kg   SpO2 95%   BMI 27.12 kg/m   Physical Exam Vitals and nursing note reviewed.  Constitutional:      General: He is not in acute distress.    Appearance: He is well-developed. He is not ill-appearing.  HENT:     Head: Normocephalic and atraumatic.     Nose: Nose normal.     Mouth/Throat:     Mouth: Mucous membranes are moist.  Eyes:     Extraocular Movements: Extraocular movements intact.     Conjunctiva/sclera: Conjunctivae normal.     Pupils: Pupils are equal, round, and reactive to light.  Cardiovascular:     Rate and Rhythm: Normal rate and regular rhythm.     Heart sounds: No murmur heard. Pulmonary:     Effort: Pulmonary effort is normal. No respiratory distress.     Breath sounds: Normal breath sounds.  Abdominal:     Palpations: Abdomen is soft.     Tenderness: There is no abdominal tenderness.  Musculoskeletal:        General: Tenderness present. No swelling.     Cervical back: Normal range of motion and neck supple. No tenderness.     Comments: Does have some tenderness in the midline and upper lumbar area, paraspinal muscle tenderness bilaterally, tenderness to the right shoulder and right elbow but good range of motion of the right shoulder and right elbow without much discomfort  Skin:    General: Skin is warm and dry.     Capillary Refill: Capillary refill takes less than 2 seconds.  Neurological:     General: No focal deficit present.     Mental Status: He is alert and oriented to person, place, and time.     Cranial Nerves: No cranial nerve deficit.     Sensory: No sensory deficit.     Motor: No weakness.     Coordination: Coordination normal.  Psychiatric:        Mood and Affect: Mood normal.    ED Results / Procedures / Treatments   Labs (all labs ordered are listed, but only abnormal results are displayed) Labs Reviewed - No data to display  EKG None  Radiology DG Lumbar Spine Complete  Result Date: 04/30/2021 CLINICAL DATA:  Fall  last night, low back pain EXAM: LUMBAR SPINE - COMPLETE 4+ VIEW COMPARISON:  05/01/2020 FINDINGS: There is a new, mild superior endplate deformity of the L1 vertebral body. Vertebral body heights are otherwise preserved. Minimal disc space height loss and endplate osteophytosis throughout. Mild multilevel facet degenerative change. Nonobstructive pattern of overlying bowel gas. IMPRESSION: 1. New, mild superior endplate deformity of the L1 vertebral body. Correlate for acute pain and point  tenderness. 2. Mild multilevel degenerative change. Electronically Signed   By: Delanna Ahmadi M.D.   On: 04/30/2021 12:22   DG Shoulder Right  Result Date: 04/30/2021 CLINICAL DATA:  Patient fell last evening now with right shoulder pain. EXAM: RIGHT SHOULDER - 2+ VIEW COMPARISON:  None FINDINGS: There is a tiny ossicle adjacent to the superior aspect of the right AC joint, likely the sequela of age-indeterminate avulsive injury. Otherwise, no acute fracture or dislocation. Mild-to-moderate degenerative change of the right glenohumeral and to a lesser extent, the right acromioclavicular joints with joint space loss, subchondral sclerosis and osteophytosis. No evidence of calcific tendinitis. Limited visualization of the adjacent thorax is normal. Regional soft tissues appear normal. IMPRESSION: 1. Tiny ossicle adjacent to the superior aspect of the right AC joint likely represent sequela of age-indeterminate avulsive injury. Correlation for point tenderness at this location is advised. 2. Mild-to-moderate degenerative change of the right glenohumeral and acromioclavicular joints Electronically Signed   By: Sandi Mariscal M.D.   On: 04/30/2021 12:25   DG Elbow Complete Right  Result Date: 04/30/2021 CLINICAL DATA:  Fall.  Pain. EXAM: RIGHT ELBOW - COMPLETE 3+ VIEW COMPARISON:  None. FINDINGS: There is no evidence of fracture, dislocation, or joint effusion. There is no evidence of arthropathy or other focal bone abnormality.  Soft tissues are unremarkable. IMPRESSION: Negative. Electronically Signed   By: Misty Stanley M.D.   On: 04/30/2021 12:21    Procedures Procedures   Medications Ordered in ED Medications - No data to display  ED Course  I have reviewed the triage vital signs and the nursing notes.  Pertinent labs & imaging results that were available during my care of the patient were reviewed by me and considered in my medical decision making (see chart for details).    MDM Rules/Calculators/A&P                           Brad Shaw is a 75 year old male with history of hypertension, high cholesterol presents to the ED after fall yesterday.  No headache or neck pain.  Neurologically intact.  Not on blood thinners.  Pain mostly to the right shoulder and low back and right elbow.  X-ray showed may be a mild L1 compression fracture.  Does have some tenderness in this area but overall he is ambulatory without much difficulty.  He follows with neurosurgery for other back issues we will have him follow-up with them and continue Tylenol, ibuprofen, Flexeril.  He does not have any symptoms of cauda equina or other acute neurologic process.  Right shoulder does show tiny ossicle adjacent to the superior aspect of the right AC joint.  Possibly an avulsion injury here.  He does have some tenderness and we will place him in a sling and have him follow-up with sports medicine.  Otherwise images are unremarkable.  He appears well.  Discharged in good condition.  This chart was dictated using voice recognition software.  Despite best efforts to proofread,  errors can occur which can change the documentation meaning.   Final Clinical Impression(s) / ED Diagnoses Final diagnoses:  Compression fracture of L1 vertebra, initial encounter ([place])  Sprain of right acromioclavicular ligament, initial encounter    Rx / DC Orders ED Discharge Orders     None        Lennice Sites, DO 04/30/21 1414

## 2021-05-02 DIAGNOSIS — C61 Malignant neoplasm of prostate: Secondary | ICD-10-CM | POA: Diagnosis not present

## 2021-05-09 DIAGNOSIS — Z6826 Body mass index (BMI) 26.0-26.9, adult: Secondary | ICD-10-CM | POA: Diagnosis not present

## 2021-05-09 DIAGNOSIS — S32010A Wedge compression fracture of first lumbar vertebra, initial encounter for closed fracture: Secondary | ICD-10-CM | POA: Diagnosis not present

## 2021-05-09 DIAGNOSIS — I10 Essential (primary) hypertension: Secondary | ICD-10-CM | POA: Diagnosis not present

## 2021-05-20 DIAGNOSIS — R54 Age-related physical debility: Secondary | ICD-10-CM | POA: Diagnosis not present

## 2021-05-20 DIAGNOSIS — S32019A Unspecified fracture of first lumbar vertebra, initial encounter for closed fracture: Secondary | ICD-10-CM | POA: Diagnosis not present

## 2021-05-20 DIAGNOSIS — I700 Atherosclerosis of aorta: Secondary | ICD-10-CM | POA: Diagnosis not present

## 2021-05-20 DIAGNOSIS — F322 Major depressive disorder, single episode, severe without psychotic features: Secondary | ICD-10-CM | POA: Diagnosis not present

## 2021-05-20 DIAGNOSIS — F0390 Unspecified dementia without behavioral disturbance: Secondary | ICD-10-CM | POA: Diagnosis not present

## 2021-05-20 DIAGNOSIS — C61 Malignant neoplasm of prostate: Secondary | ICD-10-CM | POA: Diagnosis not present

## 2021-05-20 DIAGNOSIS — I10 Essential (primary) hypertension: Secondary | ICD-10-CM | POA: Diagnosis not present

## 2021-05-20 DIAGNOSIS — E7800 Pure hypercholesterolemia, unspecified: Secondary | ICD-10-CM | POA: Diagnosis not present

## 2021-06-19 ENCOUNTER — Telehealth: Payer: Self-pay | Admitting: *Deleted

## 2021-06-19 NOTE — Telephone Encounter (Signed)
CALLED PATIENT TO INFORM OF FID. [employer] AND [employer] [facility] ON 07-26-21 AND HIS SIM ON 07-29-21, PATIENT INFORMED ME THAT HE HAS HAD A CHANGE OF HEART AND JUST WANTS TO DO HORMONE THERAPY NOW- NOTIFIED DR. MANNING

## 2021-06-19 NOTE — Telephone Encounter (Signed)
XXXX 

## 2021-06-21 DIAGNOSIS — S32010D Wedge compression fracture of first lumbar vertebra, subsequent encounter for fracture with routine healing: Secondary | ICD-10-CM | POA: Diagnosis not present

## 2021-06-21 DIAGNOSIS — S32010A Wedge compression fracture of first lumbar vertebra, initial encounter for closed fracture: Secondary | ICD-10-CM | POA: Diagnosis not present

## 2021-07-03 DIAGNOSIS — Z0000 Encounter for general adult medical examination without abnormal findings: Secondary | ICD-10-CM | POA: Diagnosis not present

## 2021-07-03 DIAGNOSIS — E7800 Pure hypercholesterolemia, unspecified: Secondary | ICD-10-CM | POA: Diagnosis not present

## 2021-07-03 DIAGNOSIS — F322 Major depressive disorder, single episode, severe without psychotic features: Secondary | ICD-10-CM | POA: Diagnosis not present

## 2021-07-03 DIAGNOSIS — I10 Essential (primary) hypertension: Secondary | ICD-10-CM | POA: Diagnosis not present

## 2021-07-03 DIAGNOSIS — F0390 Unspecified dementia without behavioral disturbance: Secondary | ICD-10-CM | POA: Diagnosis not present

## 2021-07-03 DIAGNOSIS — I700 Atherosclerosis of aorta: Secondary | ICD-10-CM | POA: Diagnosis not present

## 2021-07-03 DIAGNOSIS — C61 Malignant neoplasm of prostate: Secondary | ICD-10-CM | POA: Diagnosis not present

## 2021-07-17 ENCOUNTER — Encounter: Payer: Self-pay | Admitting: Diagnostic Neuroimaging

## 2021-07-22 ENCOUNTER — Ambulatory Visit (INDEPENDENT_AMBULATORY_CARE_PROVIDER_SITE_OTHER): Payer: Medicare Other | Admitting: Diagnostic Neuroimaging

## 2021-07-22 ENCOUNTER — Encounter: Payer: Self-pay | Admitting: Diagnostic Neuroimaging

## 2021-07-22 VITALS — BP 122/72 | HR 90 | Ht 72.0 in | Wt 195.0 lb

## 2021-07-22 DIAGNOSIS — F0280 Dementia in other diseases classified elsewhere without behavioral disturbance: Secondary | ICD-10-CM

## 2021-07-22 DIAGNOSIS — G309 Alzheimer's disease, unspecified: Secondary | ICD-10-CM

## 2021-07-22 MED ORDER — MEMANTINE HCL 10 MG PO TABS: 10.0000 mg | ORAL_TABLET | Freq: Two times a day (BID) | ORAL | 4 refills | Status: AC

## 2021-07-22 MED ORDER — DONEPEZIL HCL 10 MG PO TABS: ORAL_TABLET | ORAL | 4 refills | Status: AC

## 2021-07-22 NOTE — Patient Instructions (Signed)
MEMORY LOSS (moderate dementia; since 2019) - continue donepezil / memantine - safety / supervision issues reviewed - daily physical activity / exercise (at least 15-30 minutes) - eat more plants / vegetables - increase social activities, brain stimulation, games, puzzles, hobbies, crafts, arts, music - aim for at least 7-8 hours sleep per night (or more) - avoid smoking and alcohol - caregiver resources provided - caution with medications, finances; no driving

## 2021-07-22 NOTE — Progress Notes (Signed)
[facility]  PATIENT: Brad Shaw DOB: 1945/06/15  REFERRING CLINICIAN: Orpah Melter, MD HISTORY FROM: patient REASON FOR VISIT: new consult   HISTORICAL  CHIEF COMPLAINT:  Chief Complaint  Patient presents with   Alzheimer's disease    Rm 7 New Pt, TOC Dr Jannifer Franklin, wife- [place] Soda  MMSE 12    HISTORY OF PRESENT ILLNESS:   76 year old male here for evaluation of Alzheimer's dementia.  Symptoms started around 2019 with short-term memory loss, confusion, difficulty using computer, telephone, finances and other ADLs.  Patient was evaluated by Dr. Jannifer Franklin in the past diagnosed with dementia and started on donepezil and memantine.  Patient has done fairly well since that time with expected progression of symptoms.  Wife is very active as caregiver and overall they are doing well in their home.  He no longer drives his car.  He mainly stays at home.  They still do some yard work on their 4 acre property.  Patient was trained as a Social research officer, government, and also had hobbies including woodworking and model train building.  Overall he is in good spirits, jovial, pleasant and the visit today.  REVIEW OF SYSTEMS: Full 14 system review of systems performed and negative with exception of: as per HPI.  ALLERGIES: No Known Allergies  HOME MEDICATIONS: Outpatient Medications Prior to Visit  Medication Sig Dispense Refill   amLODipine (NORVASC) 10 MG tablet Take 10 mg by mouth daily.     ASPIRIN 81 PO Take by mouth daily.      atorvastatin (LIPITOR) 40 MG tablet Take 40 mg by mouth daily at 6 PM.      celecoxib (CELEBREX) 200 MG capsule celecoxib 200 mg capsule  Take one capsule daily as needed for pain and inflammation.     Cholecalciferol (VITAMIN D3) 50 MCG (2000 UT) TABS Take 50 mcg by mouth daily.     cyclobenzaprine (FLEXERIL) 5 MG tablet Take 5 mg by mouth daily as needed.      finasteride (PROSCAR) 5 MG tablet Take 5 mg by mouth daily.      lisinopril (ZESTRIL) 20 MG  tablet Take 20 mg by mouth daily.     sertraline (ZOLOFT) 50 MG tablet Take 50 mg by mouth daily.     Turmeric 1053 MG TABS daily. Takes on and off     donepezil (ARICEPT) 10 MG tablet Take 1 at bedtime 90 tablet 3   memantine (NAMENDA) 10 MG tablet Take 1 tablet (10 mg total) by mouth 2 (two) times daily. 180 tablet 3   No facility-administered medications prior to visit.     PHYSICAL EXAM  GENERAL EXAM/CONSTITUTIONAL: Vitals:  Vitals:   07/22/21 1415  BP: 122/72  Pulse: 90  Weight: 195 lb (88.5 kg)  Height: 6' (1.829 m)   Body mass index is 26.45 kg/m. Wt Readings from Last 3 Encounters:  07/22/21 195 lb (88.5 kg)  04/30/21 199 lb 15.3 oz (90.7 kg)  01/17/21 200 lb (90.7 kg)   Patient is in no distress; well developed, nourished and groomed; neck is supple  CARDIOVASCULAR: Examination of carotid arteries is normal; no carotid bruits Regular rate and rhythm, no murmurs Examination of peripheral vascular system by observation and palpation is normal  EYES: Ophthalmoscopic exam of optic discs and posterior segments is normal; no papilledema or hemorrhages No results found.  MUSCULOSKELETAL: Gait, strength, tone, movements noted in Neurologic exam below  NEUROLOGIC: MENTAL STATUS:  MMSE - Mini Mental State Exam 07/22/2021 01/17/2021 01/17/2021  Orientation to  time 0 1 (No Data)  Orientation to time comments - - animals-2  clock-0  Orientation to Place 2 - -  Registration 3 - -  Attention/ Calculation 0 - -  Recall 0 - -  Language- name 2 objects 2 - -  Language- repeat 0 - -  Language- follow 3 step command 3 - -  Language- read & follow direction 1 - -  Write a sentence 1 - -  Copy design 0 - -  Copy design-comments - - -  Total score 12 - -   awake, alert, oriented to person [place] attention and concentration language fluent, comprehension intact, naming intact fund of knowledge appropriate  CRANIAL NERVE:  2nd, 3rd, 4th, 6th - pupils equal  and reactive to light, visual fields full to confrontation, extraocular muscles intact, no nystagmus 5th - facial sensation symmetric 7th - facial strength symmetric 8th - hearing intact 9th - palate elevates symmetrically, uvula midline 11th - shoulder shrug symmetric 12th - tongue protrusion midline  MOTOR:  normal bulk and tone, full strength in the BUE, BLE  SENSORY:  normal and symmetric to light touch, temperature, vibration  COORDINATION:  finger-nose-finger, fine finger movements normal  REFLEXES:  deep tendon reflexes present and symmetric  GAIT/STATION:  narrow based gait     DIAGNOSTIC DATA (LABS, IMAGING, TESTING) - I reviewed patient records, labs, notes, testing and imaging myself where available.  No results found for: WBC, HGB, HCT, MCV, PLT No results found for: NA, K, CL, CO2, GLUCOSE, BUN, CREATININE, CALCIUM, PROT, ALBUMIN, AST, ALT, ALKPHOS, BILITOT, GFRNONAA, GFRAA No results found for: CHOL, HDL, LDLCALC, LDLDIRECT, TRIG, CHOLHDL No results found for: HGBA1C No results found for: VITAMINB12 No results found for: TSH   10/29/18 MRI of the brain without contrast shows the following: 1.   Moderate generalized cortical atrophy that is most pronounced in the mesial temporal lobes and mild cerebellar and corpus callosal atrophy. 2.   T2/flair hyperintense foci most consistent with mild chronic microvascular ischemic changes. 3.   There are no acute findings.    ASSESSMENT AND PLAN  76 y.o. year old male here with:   Dx:  1. Alzheimer disease ([place])     PLAN:  MEMORY LOSS (moderate dementia; since 2019) - continue donepezil / memantine - safety / supervision issues reviewed - daily physical activity / exercise (at least 15-30 minutes) - eat more plants / vegetables - increase social activities, brain stimulation, games, puzzles, hobbies, crafts, arts, music - aim for at least 7-8 hours sleep per night (or more) - avoid smoking and  alcohol - caregiver resources provided - caution with medications, finances; no driving  Meds ordered this encounter  Medications   donepezil (ARICEPT) 10 MG tablet    Sig: Take 1 at bedtime    Dispense:  90 tablet    Refill:  4   memantine (NAMENDA) 10 MG tablet    Sig: Take 1 tablet (10 mg total) by mouth 2 (two) times daily.    Dispense:  180 tablet    Refill:  4   Return for pending if symptoms worsen or fail to improve.  I spent 45 minutes of face-to-face and non-face-to-face time with patient.  This included previsit chart review, lab review, study review, order entry, electronic health record documentation, patient education.     Penni Bombard, MD 09/23/5359, 4:43 PM Certified in Neurology, Neurophysiology and Neuroimaging  [facility] [address], [place], [place] [address] [phone] ° °

## 2021-07-26 ENCOUNTER — Ambulatory Visit (HOSPITAL_BASED_OUTPATIENT_CLINIC_OR_DEPARTMENT_OTHER): Admit: 2021-07-26 | Payer: Medicare Other | Admitting: Urology

## 2021-07-26 ENCOUNTER — Encounter (HOSPITAL_BASED_OUTPATIENT_CLINIC_OR_DEPARTMENT_OTHER): Payer: Self-pay

## 2021-07-26 SURGERY — INSERTION, GOLD SEEDS
Anesthesia: Monitor Anesthesia Care

## 2021-07-29 ENCOUNTER — Ambulatory Visit: Payer: Medicare Other | Admitting: Radiation Oncology

## 2021-07-30 DIAGNOSIS — C61 Malignant neoplasm of prostate: Secondary | ICD-10-CM | POA: Diagnosis not present

## 2021-08-05 DIAGNOSIS — C61 Malignant neoplasm of prostate: Secondary | ICD-10-CM | POA: Diagnosis not present

## 2021-08-05 DIAGNOSIS — N5201 Erectile dysfunction due to arterial insufficiency: Secondary | ICD-10-CM | POA: Diagnosis not present

## 2021-08-05 DIAGNOSIS — R3121 Asymptomatic microscopic hematuria: Secondary | ICD-10-CM | POA: Diagnosis not present

## 2021-08-15 ENCOUNTER — Ambulatory Visit: Payer: Medicare Other | Admitting: Neurology

## 2021-08-16 DIAGNOSIS — J069 Acute upper respiratory infection, unspecified: Secondary | ICD-10-CM | POA: Diagnosis not present

## 2021-11-06 ENCOUNTER — Telehealth: Payer: Self-pay | Admitting: *Deleted

## 2021-11-06 NOTE — Telephone Encounter (Signed)
Pt wife called, has medication question concerning a renewal on husband medication. Pt was referred back to his PCP. Please call [phone] or [phone]

## 2021-11-07 DIAGNOSIS — C61 Malignant neoplasm of prostate: Secondary | ICD-10-CM | POA: Diagnosis not present

## 2021-11-07 NOTE — Telephone Encounter (Signed)
Letter signed and faxed to PCP, received confirmation.

## 2021-11-07 NOTE — Telephone Encounter (Signed)
Called wife who stated she thought patient was out of memory medications. She called pharmacy and found out both Rx have refills. She called PCP to ask if he would take over refills. She was told he needed note from Dr Leta Baptist with that request. I informed her we can fax a letter. Advised her Dr Leta Baptist would be glad to see him any time with worsening symptoms. Letter on MD desk for  review, signature.

## 2021-11-11 DIAGNOSIS — N5201 Erectile dysfunction due to arterial insufficiency: Secondary | ICD-10-CM | POA: Diagnosis not present

## 2021-11-11 DIAGNOSIS — C61 Malignant neoplasm of prostate: Secondary | ICD-10-CM | POA: Diagnosis not present

## 2021-12-05 DIAGNOSIS — H60501 Unspecified acute noninfective otitis externa, right ear: Secondary | ICD-10-CM | POA: Diagnosis not present

## 2021-12-05 DIAGNOSIS — L918 Other hypertrophic disorders of the skin: Secondary | ICD-10-CM | POA: Diagnosis not present

## 2021-12-05 DIAGNOSIS — J0110 Acute frontal sinusitis, unspecified: Secondary | ICD-10-CM | POA: Diagnosis not present

## 2021-12-31 ENCOUNTER — Encounter: Payer: Self-pay | Admitting: Diagnostic Neuroimaging

## 2021-12-31 DIAGNOSIS — C61 Malignant neoplasm of prostate: Secondary | ICD-10-CM | POA: Diagnosis not present

## 2021-12-31 DIAGNOSIS — F322 Major depressive disorder, single episode, severe without psychotic features: Secondary | ICD-10-CM | POA: Diagnosis not present

## 2021-12-31 DIAGNOSIS — E7800 Pure hypercholesterolemia, unspecified: Secondary | ICD-10-CM | POA: Diagnosis not present

## 2021-12-31 DIAGNOSIS — I700 Atherosclerosis of aorta: Secondary | ICD-10-CM | POA: Diagnosis not present

## 2021-12-31 DIAGNOSIS — I10 Essential (primary) hypertension: Secondary | ICD-10-CM | POA: Diagnosis not present

## 2021-12-31 DIAGNOSIS — F0390 Unspecified dementia without behavioral disturbance: Secondary | ICD-10-CM | POA: Diagnosis not present

## 2022-01-08 DIAGNOSIS — H2513 Age-related nuclear cataract, bilateral: Secondary | ICD-10-CM | POA: Diagnosis not present

## 2022-03-05 DIAGNOSIS — Z23 Encounter for immunization: Secondary | ICD-10-CM | POA: Diagnosis not present

## 2022-03-27 DIAGNOSIS — H25043 Posterior subcapsular polar age-related cataract, bilateral: Secondary | ICD-10-CM | POA: Diagnosis not present

## 2022-03-27 DIAGNOSIS — H2513 Age-related nuclear cataract, bilateral: Secondary | ICD-10-CM | POA: Diagnosis not present

## 2022-03-27 DIAGNOSIS — I10 Essential (primary) hypertension: Secondary | ICD-10-CM | POA: Diagnosis not present

## 2022-03-27 DIAGNOSIS — H25013 Cortical age-related cataract, bilateral: Secondary | ICD-10-CM | POA: Diagnosis not present

## 2022-03-28 DIAGNOSIS — J329 Chronic sinusitis, unspecified: Secondary | ICD-10-CM | POA: Diagnosis not present

## 2022-04-21 DIAGNOSIS — F0390 Unspecified dementia without behavioral disturbance: Secondary | ICD-10-CM | POA: Diagnosis not present

## 2022-05-02 DIAGNOSIS — J342 Deviated nasal septum: Secondary | ICD-10-CM | POA: Diagnosis not present

## 2022-05-02 DIAGNOSIS — J329 Chronic sinusitis, unspecified: Secondary | ICD-10-CM | POA: Diagnosis not present

## 2022-05-02 DIAGNOSIS — J320 Chronic maxillary sinusitis: Secondary | ICD-10-CM | POA: Diagnosis not present

## 2022-05-12 DIAGNOSIS — C61 Malignant neoplasm of prostate: Secondary | ICD-10-CM | POA: Diagnosis not present

## 2022-05-19 DIAGNOSIS — H25043 Posterior subcapsular polar age-related cataract, bilateral: Secondary | ICD-10-CM | POA: Diagnosis not present

## 2022-05-19 DIAGNOSIS — H18413 Arcus senilis, bilateral: Secondary | ICD-10-CM | POA: Diagnosis not present

## 2022-05-19 DIAGNOSIS — C61 Malignant neoplasm of prostate: Secondary | ICD-10-CM | POA: Diagnosis not present

## 2022-05-19 DIAGNOSIS — H2513 Age-related nuclear cataract, bilateral: Secondary | ICD-10-CM | POA: Diagnosis not present

## 2022-05-19 DIAGNOSIS — H25013 Cortical age-related cataract, bilateral: Secondary | ICD-10-CM | POA: Diagnosis not present

## 2022-05-19 DIAGNOSIS — H2512 Age-related nuclear cataract, left eye: Secondary | ICD-10-CM | POA: Diagnosis not present

## 2022-06-04 ENCOUNTER — Encounter (INDEPENDENT_AMBULATORY_CARE_PROVIDER_SITE_OTHER): Payer: Medicare Other | Admitting: Ophthalmology

## 2022-06-16 ENCOUNTER — Encounter (INDEPENDENT_AMBULATORY_CARE_PROVIDER_SITE_OTHER): Payer: Medicare Other | Admitting: Ophthalmology

## 2022-07-07 DIAGNOSIS — F0390 Unspecified dementia without behavioral disturbance: Secondary | ICD-10-CM | POA: Diagnosis not present

## 2022-07-07 DIAGNOSIS — R4181 Age-related cognitive decline: Secondary | ICD-10-CM | POA: Diagnosis not present

## 2022-07-07 DIAGNOSIS — I700 Atherosclerosis of aorta: Secondary | ICD-10-CM | POA: Diagnosis not present

## 2022-07-07 DIAGNOSIS — F322 Major depressive disorder, single episode, severe without psychotic features: Secondary | ICD-10-CM | POA: Diagnosis not present

## 2022-07-07 DIAGNOSIS — Z9181 History of falling: Secondary | ICD-10-CM | POA: Diagnosis not present

## 2022-07-07 DIAGNOSIS — C61 Malignant neoplasm of prostate: Secondary | ICD-10-CM | POA: Diagnosis not present

## 2022-07-07 DIAGNOSIS — Z0000 Encounter for general adult medical examination without abnormal findings: Secondary | ICD-10-CM | POA: Diagnosis not present

## 2022-07-07 DIAGNOSIS — E7800 Pure hypercholesterolemia, unspecified: Secondary | ICD-10-CM | POA: Diagnosis not present

## 2022-07-07 DIAGNOSIS — I10 Essential (primary) hypertension: Secondary | ICD-10-CM | POA: Diagnosis not present

## 2022-07-10 DIAGNOSIS — I10 Essential (primary) hypertension: Secondary | ICD-10-CM | POA: Diagnosis not present

## 2022-07-10 DIAGNOSIS — E7800 Pure hypercholesterolemia, unspecified: Secondary | ICD-10-CM | POA: Diagnosis not present

## 2022-09-09 DIAGNOSIS — I889 Nonspecific lymphadenitis, unspecified: Secondary | ICD-10-CM | POA: Diagnosis not present

## 2022-09-19 DIAGNOSIS — R590 Localized enlarged lymph nodes: Secondary | ICD-10-CM | POA: Diagnosis not present

## 2022-09-19 DIAGNOSIS — L989 Disorder of the skin and subcutaneous tissue, unspecified: Secondary | ICD-10-CM | POA: Diagnosis not present

## 2022-09-22 ENCOUNTER — Encounter (HOSPITAL_BASED_OUTPATIENT_CLINIC_OR_DEPARTMENT_OTHER): Payer: Self-pay | Admitting: Family Medicine

## 2022-09-24 DIAGNOSIS — C44329 Squamous cell carcinoma of skin of other parts of face: Secondary | ICD-10-CM | POA: Diagnosis not present

## 2022-09-26 ENCOUNTER — Other Ambulatory Visit (HOSPITAL_BASED_OUTPATIENT_CLINIC_OR_DEPARTMENT_OTHER): Payer: Self-pay | Admitting: Family Medicine

## 2022-09-26 DIAGNOSIS — R590 Localized enlarged lymph nodes: Secondary | ICD-10-CM

## 2022-09-29 ENCOUNTER — Ambulatory Visit (HOSPITAL_BASED_OUTPATIENT_CLINIC_OR_DEPARTMENT_OTHER)
Admission: RE | Admit: 2022-09-29 | Discharge: 2022-09-29 | Disposition: A | Discharge status: Home / Self Care | Payer: Medicare Other | Source: Ambulatory Visit | Attending: Family Medicine | Admitting: Family Medicine

## 2022-09-29 DIAGNOSIS — R590 Localized enlarged lymph nodes: Secondary | ICD-10-CM | POA: Diagnosis not present

## 2022-09-29 DIAGNOSIS — R221 Localized swelling, mass and lump, neck: Secondary | ICD-10-CM | POA: Diagnosis not present

## 2022-10-24 DIAGNOSIS — R1032 Left lower quadrant pain: Secondary | ICD-10-CM | POA: Diagnosis not present

## 2022-10-30 DIAGNOSIS — G309 Alzheimer's disease, unspecified: Secondary | ICD-10-CM | POA: Diagnosis not present

## 2022-10-30 DIAGNOSIS — F02C0 Dementia in other diseases classified elsewhere, severe, without behavioral disturbance, psychotic disturbance, mood disturbance, and anxiety: Secondary | ICD-10-CM | POA: Diagnosis not present

## 2022-11-05 DIAGNOSIS — Z1330 Encounter for screening examination for mental health and behavioral disorders, unspecified: Secondary | ICD-10-CM | POA: Diagnosis not present

## 2022-11-05 DIAGNOSIS — G309 Alzheimer's disease, unspecified: Secondary | ICD-10-CM | POA: Diagnosis not present

## 2022-11-05 DIAGNOSIS — F0280 Dementia in other diseases classified elsewhere without behavioral disturbance: Secondary | ICD-10-CM | POA: Diagnosis not present

## 2022-11-11 DIAGNOSIS — C44329 Squamous cell carcinoma of skin of other parts of face: Secondary | ICD-10-CM | POA: Diagnosis not present

## 2022-11-17 DIAGNOSIS — C61 Malignant neoplasm of prostate: Secondary | ICD-10-CM | POA: Diagnosis not present

## 2022-11-18 DIAGNOSIS — L905 Scar conditions and fibrosis of skin: Secondary | ICD-10-CM | POA: Diagnosis not present

## 2022-11-24 DIAGNOSIS — R3121 Asymptomatic microscopic hematuria: Secondary | ICD-10-CM | POA: Diagnosis not present

## 2022-11-24 DIAGNOSIS — C61 Malignant neoplasm of prostate: Secondary | ICD-10-CM | POA: Diagnosis not present

## 2022-11-24 DIAGNOSIS — N5201 Erectile dysfunction due to arterial insufficiency: Secondary | ICD-10-CM | POA: Diagnosis not present

## 2022-12-01 DIAGNOSIS — W908XXS Exposure to other nonionizing radiation, sequela: Secondary | ICD-10-CM | POA: Diagnosis not present

## 2022-12-01 DIAGNOSIS — L570 Actinic keratosis: Secondary | ICD-10-CM | POA: Diagnosis not present

## 2022-12-01 DIAGNOSIS — L2089 Other atopic dermatitis: Secondary | ICD-10-CM | POA: Diagnosis not present

## 2022-12-01 DIAGNOSIS — L578 Other skin changes due to chronic exposure to nonionizing radiation: Secondary | ICD-10-CM | POA: Diagnosis not present

## 2022-12-01 DIAGNOSIS — L821 Other seborrheic keratosis: Secondary | ICD-10-CM | POA: Diagnosis not present

## 2022-12-20 ENCOUNTER — Encounter (HOSPITAL_COMMUNITY): Payer: Self-pay

## 2022-12-20 ENCOUNTER — Emergency Department (HOSPITAL_COMMUNITY): Payer: Medicare Other

## 2022-12-20 ENCOUNTER — Emergency Department (HOSPITAL_COMMUNITY)
Admission: EM | Admit: 2022-12-20 | Discharge: 2022-12-20 | Disposition: A | Discharge status: Home / Self Care | Payer: Medicare Other | Attending: Student | Admitting: Student

## 2022-12-20 ENCOUNTER — Other Ambulatory Visit: Payer: Self-pay

## 2022-12-20 DIAGNOSIS — S42215A Unspecified nondisplaced fracture of surgical neck of left humerus, initial encounter for closed fracture: Secondary | ICD-10-CM

## 2022-12-20 DIAGNOSIS — M85822 Other specified disorders of bone density and structure, left upper arm: Secondary | ICD-10-CM | POA: Diagnosis not present

## 2022-12-20 DIAGNOSIS — S42292A Other displaced fracture of upper end of left humerus, initial encounter for closed fracture: Secondary | ICD-10-CM | POA: Diagnosis not present

## 2022-12-20 DIAGNOSIS — Z043 Encounter for examination and observation following other accident: Secondary | ICD-10-CM | POA: Diagnosis not present

## 2022-12-20 DIAGNOSIS — R2232 Localized swelling, mass and lump, left upper limb: Secondary | ICD-10-CM | POA: Diagnosis not present

## 2022-12-20 DIAGNOSIS — Z7982 Long term (current) use of aspirin: Secondary | ICD-10-CM | POA: Diagnosis not present

## 2022-12-20 DIAGNOSIS — S4980XA Other specified injuries of shoulder and upper arm, unspecified arm, initial encounter: Secondary | ICD-10-CM | POA: Diagnosis not present

## 2022-12-20 DIAGNOSIS — M778 Other enthesopathies, not elsewhere classified: Secondary | ICD-10-CM | POA: Diagnosis not present

## 2022-12-20 DIAGNOSIS — M19012 Primary osteoarthritis, left shoulder: Secondary | ICD-10-CM | POA: Diagnosis not present

## 2022-12-20 DIAGNOSIS — M25519 Pain in unspecified shoulder: Secondary | ICD-10-CM | POA: Diagnosis not present

## 2022-12-20 DIAGNOSIS — M19022 Primary osteoarthritis, left elbow: Secondary | ICD-10-CM | POA: Diagnosis not present

## 2022-12-20 DIAGNOSIS — S42212A Unspecified displaced fracture of surgical neck of left humerus, initial encounter for closed fracture: Secondary | ICD-10-CM | POA: Diagnosis not present

## 2022-12-20 DIAGNOSIS — G309 Alzheimer's disease, unspecified: Secondary | ICD-10-CM | POA: Diagnosis not present

## 2022-12-20 DIAGNOSIS — Z79899 Other long term (current) drug therapy: Secondary | ICD-10-CM | POA: Insufficient documentation

## 2022-12-20 DIAGNOSIS — I10 Essential (primary) hypertension: Secondary | ICD-10-CM | POA: Insufficient documentation

## 2022-12-20 DIAGNOSIS — W19XXXA Unspecified fall, initial encounter: Secondary | ICD-10-CM

## 2022-12-20 DIAGNOSIS — W010XXA Fall on same level from slipping, tripping and stumbling without subsequent striking against object, initial encounter: Secondary | ICD-10-CM | POA: Insufficient documentation

## 2022-12-20 DIAGNOSIS — F0390 Unspecified dementia without behavioral disturbance: Secondary | ICD-10-CM | POA: Diagnosis not present

## 2022-12-20 DIAGNOSIS — S0101XA Laceration without foreign body of scalp, initial encounter: Secondary | ICD-10-CM | POA: Diagnosis not present

## 2022-12-20 DIAGNOSIS — S0990XA Unspecified injury of head, initial encounter: Secondary | ICD-10-CM | POA: Diagnosis not present

## 2022-12-20 DIAGNOSIS — S199XXA Unspecified injury of neck, initial encounter: Secondary | ICD-10-CM | POA: Diagnosis not present

## 2022-12-20 MED ORDER — OXYCODONE HCL 5 MG PO TABS: 5.0000 mg | ORAL_TABLET | ORAL | 0 refills | Status: DC | PRN

## 2022-12-20 MED ORDER — NAPROXEN 500 MG PO TABS: 500.0000 mg | ORAL_TABLET | Freq: Two times a day (BID) | ORAL | 0 refills | Status: DC

## 2022-12-20 MED ORDER — OXYCODONE-ACETAMINOPHEN 5-325 MG PO TABS
1.0000 | ORAL_TABLET | Freq: Once | ORAL | Status: AC
  Administered 2022-12-20: 1 via ORAL
  Filled 2022-12-20: qty 1

## 2022-12-20 MED ORDER — ACETAMINOPHEN 500 MG PO TABS: 1000.0000 mg | ORAL_TABLET | Freq: Three times a day (TID) | ORAL | 0 refills | Status: AC

## 2022-12-20 NOTE — ED Triage Notes (Signed)
Pt arrived EMS from home for fall- injury to left shoulder- sling present, placed by EMS- Fentanyl given 50 mcg IV, and 400 ml NS given in route

## 2022-12-20 NOTE — ED Provider Notes (Signed)
[place] EMERGENCY DEPARTMENT AT [facility] Provider Note  CSN: [id number] Arrival date & time: 12/20/22 2002  Chief Complaint(s) Fall  HPI Brad Shaw is a 77 y.o. male with PMH Alzheimer's dementia, BPH who presents emergency room for evaluation of fall and arm pain.  History surrounding the fall is unclear but history obtained from patient's wife states that she was in the kitchen and found her husband on the ground of the mud room.  Likely tripped up a few stairs.  Arrives with a small laceration over the scalp on the left with significant swelling over the left shoulder.  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 Authorizing Provider  amLODipine (NORVASC) 10 MG tablet Take 10 mg by mouth daily. 07/15/18   [provider]  ASPIRIN 81 PO Take by mouth daily.     [provider]  atorvastatin (LIPITOR) 40 MG tablet Take 40 mg by mouth daily at 6 PM.  06/25/18   [provider]  celecoxib (CELEBREX) 200 MG capsule celecoxib 200 mg capsule  Take one capsule daily as needed for pain and inflammation.    [provider]  Cholecalciferol (VITAMIN D3) 50 MCG (2000 UT) TABS Take 50 mcg by mouth daily.    [provider]  cyclobenzaprine (FLEXERIL) 5 MG tablet Take 5 mg by mouth daily as needed.  05/10/18    [provider]  donepezil (ARICEPT) 10 MG tablet Take 1 at bedtime 07/22/21   Penumalli, Vikram R, MD  finasteride (PROSCAR) 5 MG tablet Take 5 mg by mouth daily.  06/16/18   [provider]  lisinopril (ZESTRIL) 20 MG tablet Take 20 mg by mouth daily. 02/07/20   [provider]  memantine (NAMENDA) 10 MG tablet Take 1 tablet (10 mg total) by mouth 2 (two) times daily. 07/22/21   Penumalli, Glenford Bayley, MD  sertraline (ZOLOFT) 50 MG tablet Take 50 mg by mouth daily. 06/14/21   [provider]  Turmeric 1053 MG TABS daily. Takes on and off    [provider]                                                                                                                                    Past Surgical History Past Surgical  History:  Procedure Laterality Date   TONSILLECTOMY     Family History Family History  Problem Relation Age of Onset   Cirrhosis Mother    Heart disease Father     Social History Social History   Tobacco Use   Smoking status: Never   Smokeless tobacco: Never  Vaping Use   Vaping status: Never Used  Substance Use Topics   Alcohol use: Yes    Comment: 3-4 per week   Drug use: No   Allergies Cefdinir  Review of Systems Review of Systems  Unable to perform ROS: Dementia    Physical Exam Vital Signs  I have reviewed the triage vital signs Ht 6' (1.829 m)   Wt 88.5 kg   BMI 26.46 kg/m   Physical Exam Constitutional:      General: He is not in acute distress.    Appearance: Normal appearance.  HENT:     Head: Normocephalic.     Comments: Scalp laceration    Nose: No congestion or rhinorrhea.  Eyes:     General:        Right eye: No discharge.        Left eye: No discharge.     Extraocular Movements: Extraocular movements intact.     Pupils: Pupils are equal, round, and reactive to light.  Cardiovascular:     Rate and Rhythm: Normal rate and regular rhythm.     Heart sounds: No murmur heard. Pulmonary:      Effort: No respiratory distress.     Breath sounds: No wheezing or rales.  Abdominal:     General: There is no distension.     Tenderness: There is no abdominal tenderness.  Musculoskeletal:        General: Swelling, tenderness and deformity present. Normal range of motion.     Cervical back: Normal range of motion.  Skin:    General: Skin is warm and dry.  Neurological:     General: No focal deficit present.     Mental Status: He is alert.     ED Results and Treatments Labs (all labs ordered are listed, but only abnormal results are displayed) Labs Reviewed - No data to display                                                                                                                        Radiology DG Shoulder Left  Result Date: 12/20/2022 CLINICAL DATA:  Recent fall with left shoulder pain, initial encounter EXAM: LEFT SHOULDER - 2+ VIEW COMPARISON:  None Available. FINDINGS: There is a fracture of the proximal left humerus involving primarily the surgical neck with impaction at the fracture site. No dislocation is noted. The underlying bony thorax appears within normal limits. Degenerative changes of the acromioclavicular joint are seen. IMPRESSION: Proximal left humeral fracture as described. Electronically Signed   By: Alcide Clever M.D.   On: 12/20/2022 21:00   DG Humerus Left  Result Date: 12/20/2022 CLINICAL DATA:  Fall EXAM: LEFT HUMERUS - 2+ VIEW COMPARISON:  None Available. FINDINGS: The bones are osteopenic. There is an acute comminuted fracture through the humeral neck which appears impacted. There is no evidence for shoulder dislocation. There are mild degenerative changes of the shoulder and acromioclavicular joint. Soft tissues are within normal limits. IMPRESSION: Acute comminuted fracture through the humeral neck which appears impacted. Electronically Signed   By: Darliss Cheney M.D.   On: 12/20/2022 20:58   DG Elbow Complete Left  Result Date:  12/20/2022 CLINICAL DATA:  Fall EXAM: LEFT ELBOW - COMPLETE 3+ VIEW COMPARISON:  None Available. FINDINGS: Examination is limited secondary to patient positioning, specifically the lateral view. No displaced fractures are seen. Can not assess for normal alignment of the radial head secondary to patient positioning. There is posterior soft tissue swelling and a small olecranon spur. There are mild degenerative changes of the elbow. IMPRESSION: 1. No displaced fractures are seen. Can not assess for normal alignment of the radial head secondary to patient positioning. Consider repeat evaluation (three-view). 2. Posterior soft tissue swelling. Electronically Signed   By: Darliss Cheney M.D.   On: 12/20/2022 20:57   CT Head Wo Contrast  Result Date: 12/20/2022 CLINICAL DATA:  Head trauma, minor (Age >= 65y); Neck trauma (Age >= 65y) EXAM: CT HEAD WITHOUT CONTRAST CT CERVICAL SPINE WITHOUT CONTRAST TECHNIQUE: Multidetector CT imaging of the head and cervical spine was performed following the standard protocol without intravenous contrast. Multiplanar CT image reconstructions of the cervical spine were also generated. RADIATION DOSE REDUCTION: This exam was performed according to the departmental dose-optimization program which includes automated exposure control, adjustment of the mA and/or kV according to patient size and/or use of iterative reconstruction technique. COMPARISON:  None Available. FINDINGS: CT HEAD FINDINGS Brain: No evidence of acute infarction, hemorrhage, hydrocephalus, extra-axial collection or mass lesion/mass effect. Vascular: No hyperdense vessel or unexpected calcification. Skull: Normal. Negative for fracture or focal lesion. Sinuses/Orbits: No middle ear or mastoid effusion. Paranasal sinuses are notable for minimal mucosal thickening in the left sphenoid sinus. Orbits are unremarkable. Other: None. CT CERVICAL SPINE FINDINGS Alignment: Grade 1 anterolisthesis of C3 on C4. Skull base and  vertebrae: No acute fracture. No primary bone lesion or focal pathologic process. Soft tissues and spinal canal: No prevertebral fluid or swelling. No visible canal hematoma. Disc levels:  No evidence of high-grade spinal canal stenosis. Upper chest: Negative. Other: None IMPRESSION: 1. No acute intracranial abnormality. 2. No acute fracture or traumatic subluxation of the cervical spine. Electronically Signed   By: Lorenza Cambridge M.D.   On: 12/20/2022 20:45   CT Cervical Spine Wo Contrast  Result Date: 12/20/2022 CLINICAL DATA:  Head trauma, minor (Age >= 65y); Neck trauma (Age >= 65y) EXAM: CT HEAD WITHOUT CONTRAST CT CERVICAL SPINE WITHOUT CONTRAST TECHNIQUE: Multidetector CT imaging of the head and cervical spine was performed following the standard protocol without intravenous contrast. Multiplanar CT image reconstructions of the cervical spine were also generated. RADIATION DOSE REDUCTION: This exam was performed according to the departmental dose-optimization program which includes automated exposure control, adjustment of the mA and/or kV according to patient size and/or use of iterative reconstruction technique. COMPARISON:  None Available. FINDINGS: CT HEAD FINDINGS Brain: No evidence of acute infarction, hemorrhage, hydrocephalus, extra-axial collection or mass lesion/mass effect. Vascular: No hyperdense vessel or unexpected calcification. Skull: Normal. Negative for fracture or focal lesion. Sinuses/Orbits: No middle ear or mastoid effusion. Paranasal sinuses are notable for minimal mucosal thickening in the left  sphenoid sinus. Orbits are unremarkable. Other: None. CT CERVICAL SPINE FINDINGS Alignment: Grade 1 anterolisthesis of C3 on C4. Skull base and vertebrae: No acute fracture. No primary bone lesion or focal pathologic process. Soft tissues and spinal canal: No prevertebral fluid or swelling. No visible canal hematoma. Disc levels:  No evidence of high-grade spinal canal stenosis. Upper chest:  Negative. Other: None IMPRESSION: 1. No acute intracranial abnormality. 2. No acute fracture or traumatic subluxation of the cervical spine. Electronically Signed   By: Lorenza Cambridge M.D.   On: 12/20/2022 20:45    Pertinent labs & imaging results that were available during my care of the patient were reviewed by me and considered in my medical decision making (see MDM for details).  Medications Ordered in ED Medications  oxyCODONE-acetaminophen (PERCOCET/ROXICET) 5-325 MG per tablet 1 tablet (has no administration in time range)                                                                                                                                     Procedures .Marland KitchenLaceration Repair  Date/Time: 12/20/2022 9:12 PM  Performed by: Glendora Score, MD Authorized by: Glendora Score, MD   Anesthesia:    Anesthesia method:  None Laceration details:    Location:  Scalp   Scalp location:  L temporal   Length (cm):  3 Treatment:    Area cleansed with:  Soap and water   Amount of cleaning:  Standard   Debridement:  None   Undermining:  None Skin repair:    Repair method:  Staples   Number of staples:  3 Approximation:    Approximation:  Close Repair type:    Repair type:  Simple Post-procedure details:    Dressing:  Open (no dressing) .Ortho Injury Treatment  Date/Time: 12/20/2022 9:13 PM  Performed by: Glendora Score, MD Authorized by: Glendora Score, MD   Consent:    Consent obtained:  Verbal   Consent given by:  Spouse   Risks discussed:  Fracture, nerve damage, restricted joint movement, vascular damage, recurrent dislocation, irreducible dislocation and stiffness   Alternatives discussed:  ImmobilizationInjury location: shoulder Location details: left shoulder Injury type: fracture Fracture type: surgical neck Pre-procedure distal perfusion: normal Pre-procedure neurological function: normal Pre-procedure range of motion: reduced  Anesthesia: Local anesthesia  used: no  Patient sedated: NoManipulation performed: no Immobilization: sling Splint Applied by: ED Nurse Supplies used: sling. Post-procedure distal perfusion: normal Post-procedure neurological function: normal Post-procedure range of motion: unchanged     (including critical care time)  Medical Decision Making / ED Course   This patient presents to the ED for concern of fall, shoulder pain, this involves an extensive number of treatment options, and is a complaint that carries with it a high risk of complications and morbidity.  The differential diagnosis includes closed head injury, laceration, contusion, hematoma, concussion, fracture, dislocation, ICH  MDM: Patient seen emergency room for evaluation of a  fall with head injury and shoulder pain.  Physical exam with 3 similar laceration over the left temporal bone, significant swelling of the left shoulder and mild tenderness to the left elbow.  Trauma imaging revealing impacted humeral neck fracture but is otherwise unremarkable.  Pain control given, laceration repaired with staples and patient placed in a sling and swath.  Outpatient orthopedic referral was sent and at this time patient does not meet inpatient criteria for admission.  Patient given return precautions of which his wife voiced understanding and he was discharged with outpatient orthopedic follow-up.   Additional history obtained: -Additional history obtained from wife -External records from outside source obtained and reviewed including: Chart review including previous notes, labs, imaging, consultation notes     Imaging Studies ordered: I ordered imaging studies including CT head, C-spine, x-ray shoulder, humerus, elbow I independently visualized and interpreted imaging. I agree with the radiologist interpretation   Medicines ordered and prescription drug management: Meds ordered this encounter  Medications   oxyCODONE-acetaminophen (PERCOCET/ROXICET)  5-325 MG per tablet 1 tablet    -I have reviewed the patients home medicines and have made adjustments as needed  Critical interventions none    Cardiac Monitoring: The patient was maintained on a cardiac monitor.  I personally viewed and interpreted the cardiac monitored which showed an underlying rhythm of: NSR  Social Determinants of Health:  Factors impacting patients care include: none   Reevaluation: After the interventions noted above, I reevaluated the patient and found that they have :improved  Co morbidities that complicate the patient evaluation  Past Medical History:  Diagnosis Date   Alzheimer disease (HCC) 01/17/2021   BPH (benign prostatic hyperplasia)    High cholesterol    Hypertension    Prostate cancer (HCC)       Dispostion: I considered admission for this patient, but at this time he does not meet inpatient criteria for admission he is safe for discharge with outpatient follow-up     Final Clinical Impression(s) / ED Diagnoses Final diagnoses:  None     @PCDICTATION @    Glendora Score, MD 12/20/22 2116

## 2022-12-20 NOTE — ED Notes (Signed)
Patient transported to CT/Xray. 

## 2022-12-20 NOTE — Discharge Instructions (Signed)
For pain:  - Acetaminophen 1000 mg three times daily (every 8 hours) - Naproxen 2 times daily (every 12 hours) - oxycodone for breakthrough pain only 

## 2022-12-24 DIAGNOSIS — M25512 Pain in left shoulder: Secondary | ICD-10-CM | POA: Diagnosis not present

## 2022-12-24 DIAGNOSIS — S42202A Unspecified fracture of upper end of left humerus, initial encounter for closed fracture: Secondary | ICD-10-CM | POA: Diagnosis not present

## 2022-12-30 DIAGNOSIS — Z9181 History of falling: Secondary | ICD-10-CM | POA: Diagnosis not present

## 2022-12-30 DIAGNOSIS — Z741 Need for assistance with personal care: Secondary | ICD-10-CM | POA: Diagnosis not present

## 2022-12-30 DIAGNOSIS — Z4802 Encounter for removal of sutures: Secondary | ICD-10-CM | POA: Diagnosis not present

## 2022-12-30 DIAGNOSIS — S42295D Other nondisplaced fracture of upper end of left humerus, subsequent encounter for fracture with routine healing: Secondary | ICD-10-CM | POA: Diagnosis not present

## 2022-12-30 DIAGNOSIS — S0101XD Laceration without foreign body of scalp, subsequent encounter: Secondary | ICD-10-CM | POA: Diagnosis not present

## 2023-01-07 DIAGNOSIS — S42202A Unspecified fracture of upper end of left humerus, initial encounter for closed fracture: Secondary | ICD-10-CM | POA: Diagnosis not present

## 2023-01-10 DIAGNOSIS — H269 Unspecified cataract: Secondary | ICD-10-CM | POA: Diagnosis not present

## 2023-01-10 DIAGNOSIS — G309 Alzheimer's disease, unspecified: Secondary | ICD-10-CM | POA: Diagnosis not present

## 2023-01-10 DIAGNOSIS — N400 Enlarged prostate without lower urinary tract symptoms: Secondary | ICD-10-CM | POA: Diagnosis not present

## 2023-01-10 DIAGNOSIS — Z9181 History of falling: Secondary | ICD-10-CM | POA: Diagnosis not present

## 2023-01-10 DIAGNOSIS — K5730 Diverticulosis of large intestine without perforation or abscess without bleeding: Secondary | ICD-10-CM | POA: Diagnosis not present

## 2023-01-10 DIAGNOSIS — E7800 Pure hypercholesterolemia, unspecified: Secondary | ICD-10-CM | POA: Diagnosis not present

## 2023-01-10 DIAGNOSIS — Z6826 Body mass index (BMI) 26.0-26.9, adult: Secondary | ICD-10-CM | POA: Diagnosis not present

## 2023-01-10 DIAGNOSIS — I700 Atherosclerosis of aorta: Secondary | ICD-10-CM | POA: Diagnosis not present

## 2023-01-10 DIAGNOSIS — F0283 Dementia in other diseases classified elsewhere, unspecified severity, with mood disturbance: Secondary | ICD-10-CM | POA: Diagnosis not present

## 2023-01-10 DIAGNOSIS — J321 Chronic frontal sinusitis: Secondary | ICD-10-CM | POA: Diagnosis not present

## 2023-01-10 DIAGNOSIS — E663 Overweight: Secondary | ICD-10-CM | POA: Diagnosis not present

## 2023-01-10 DIAGNOSIS — Z8546 Personal history of malignant neoplasm of prostate: Secondary | ICD-10-CM | POA: Diagnosis not present

## 2023-01-10 DIAGNOSIS — S42295D Other nondisplaced fracture of upper end of left humerus, subsequent encounter for fracture with routine healing: Secondary | ICD-10-CM | POA: Diagnosis not present

## 2023-01-10 DIAGNOSIS — R7303 Prediabetes: Secondary | ICD-10-CM | POA: Diagnosis not present

## 2023-01-10 DIAGNOSIS — Z85828 Personal history of other malignant neoplasm of skin: Secondary | ICD-10-CM | POA: Diagnosis not present

## 2023-01-10 DIAGNOSIS — Z7982 Long term (current) use of aspirin: Secondary | ICD-10-CM | POA: Diagnosis not present

## 2023-01-10 DIAGNOSIS — I10 Essential (primary) hypertension: Secondary | ICD-10-CM | POA: Diagnosis not present

## 2023-01-10 DIAGNOSIS — F329 Major depressive disorder, single episode, unspecified: Secondary | ICD-10-CM | POA: Diagnosis not present

## 2023-01-10 DIAGNOSIS — M5136 Other intervertebral disc degeneration, lumbar region: Secondary | ICD-10-CM | POA: Diagnosis not present

## 2023-01-13 DIAGNOSIS — H2513 Age-related nuclear cataract, bilateral: Secondary | ICD-10-CM | POA: Diagnosis not present

## 2023-01-14 DIAGNOSIS — Z9181 History of falling: Secondary | ICD-10-CM | POA: Diagnosis not present

## 2023-01-14 DIAGNOSIS — E7800 Pure hypercholesterolemia, unspecified: Secondary | ICD-10-CM | POA: Diagnosis not present

## 2023-01-14 DIAGNOSIS — I10 Essential (primary) hypertension: Secondary | ICD-10-CM | POA: Diagnosis not present

## 2023-01-14 DIAGNOSIS — I700 Atherosclerosis of aorta: Secondary | ICD-10-CM | POA: Diagnosis not present

## 2023-01-14 DIAGNOSIS — F322 Major depressive disorder, single episode, severe without psychotic features: Secondary | ICD-10-CM | POA: Diagnosis not present

## 2023-01-14 DIAGNOSIS — C61 Malignant neoplasm of prostate: Secondary | ICD-10-CM | POA: Diagnosis not present

## 2023-01-14 DIAGNOSIS — R7303 Prediabetes: Secondary | ICD-10-CM | POA: Diagnosis not present

## 2023-01-14 DIAGNOSIS — F0390 Unspecified dementia without behavioral disturbance: Secondary | ICD-10-CM | POA: Diagnosis not present

## 2023-01-16 DIAGNOSIS — I10 Essential (primary) hypertension: Secondary | ICD-10-CM | POA: Diagnosis not present

## 2023-01-16 DIAGNOSIS — G309 Alzheimer's disease, unspecified: Secondary | ICD-10-CM | POA: Diagnosis not present

## 2023-01-16 DIAGNOSIS — S42295D Other nondisplaced fracture of upper end of left humerus, subsequent encounter for fracture with routine healing: Secondary | ICD-10-CM | POA: Diagnosis not present

## 2023-01-16 DIAGNOSIS — F329 Major depressive disorder, single episode, unspecified: Secondary | ICD-10-CM | POA: Diagnosis not present

## 2023-01-16 DIAGNOSIS — F0283 Dementia in other diseases classified elsewhere, unspecified severity, with mood disturbance: Secondary | ICD-10-CM | POA: Diagnosis not present

## 2023-01-16 DIAGNOSIS — M5136 Other intervertebral disc degeneration, lumbar region: Secondary | ICD-10-CM | POA: Diagnosis not present

## 2023-01-20 DIAGNOSIS — M25512 Pain in left shoulder: Secondary | ICD-10-CM | POA: Diagnosis not present

## 2023-01-22 DIAGNOSIS — F0283 Dementia in other diseases classified elsewhere, unspecified severity, with mood disturbance: Secondary | ICD-10-CM | POA: Diagnosis not present

## 2023-01-22 DIAGNOSIS — M5136 Other intervertebral disc degeneration, lumbar region: Secondary | ICD-10-CM | POA: Diagnosis not present

## 2023-01-22 DIAGNOSIS — S42295D Other nondisplaced fracture of upper end of left humerus, subsequent encounter for fracture with routine healing: Secondary | ICD-10-CM | POA: Diagnosis not present

## 2023-01-22 DIAGNOSIS — I10 Essential (primary) hypertension: Secondary | ICD-10-CM | POA: Diagnosis not present

## 2023-01-22 DIAGNOSIS — F329 Major depressive disorder, single episode, unspecified: Secondary | ICD-10-CM | POA: Diagnosis not present

## 2023-01-22 DIAGNOSIS — G309 Alzheimer's disease, unspecified: Secondary | ICD-10-CM | POA: Diagnosis not present

## 2023-01-23 DIAGNOSIS — L24A2 Irritant contact dermatitis due to fecal, urinary or dual incontinence: Secondary | ICD-10-CM | POA: Diagnosis not present

## 2023-01-25 ENCOUNTER — Encounter (HOSPITAL_COMMUNITY): Payer: Self-pay

## 2023-01-25 ENCOUNTER — Inpatient Hospital Stay (HOSPITAL_COMMUNITY)
Admission: EM | Admit: 2023-01-25 | Discharge: 2023-01-29 | DRG: 872 | Disposition: A | Discharge status: Skilled Nursing Facility | Payer: Medicare Other | Attending: Family Medicine | Admitting: Family Medicine

## 2023-01-25 ENCOUNTER — Emergency Department (HOSPITAL_COMMUNITY): Payer: Medicare Other

## 2023-01-25 ENCOUNTER — Other Ambulatory Visit: Payer: Self-pay

## 2023-01-25 DIAGNOSIS — R0902 Hypoxemia: Secondary | ICD-10-CM | POA: Diagnosis not present

## 2023-01-25 DIAGNOSIS — Z79899 Other long term (current) drug therapy: Secondary | ICD-10-CM

## 2023-01-25 DIAGNOSIS — A419 Sepsis, unspecified organism: Secondary | ICD-10-CM | POA: Diagnosis not present

## 2023-01-25 DIAGNOSIS — N400 Enlarged prostate without lower urinary tract symptoms: Secondary | ICD-10-CM | POA: Diagnosis present

## 2023-01-25 DIAGNOSIS — F0280 Dementia in other diseases classified elsewhere without behavioral disturbance: Secondary | ICD-10-CM

## 2023-01-25 DIAGNOSIS — R739 Hyperglycemia, unspecified: Secondary | ICD-10-CM

## 2023-01-25 DIAGNOSIS — R509 Fever, unspecified: Secondary | ICD-10-CM | POA: Diagnosis not present

## 2023-01-25 DIAGNOSIS — Z8249 Family history of ischemic heart disease and other diseases of the circulatory system: Secondary | ICD-10-CM | POA: Diagnosis not present

## 2023-01-25 DIAGNOSIS — R41841 Cognitive communication deficit: Secondary | ICD-10-CM | POA: Diagnosis not present

## 2023-01-25 DIAGNOSIS — F0390 Unspecified dementia without behavioral disturbance: Secondary | ICD-10-CM | POA: Diagnosis not present

## 2023-01-25 DIAGNOSIS — Z8546 Personal history of malignant neoplasm of prostate: Secondary | ICD-10-CM

## 2023-01-25 DIAGNOSIS — Z9181 History of falling: Secondary | ICD-10-CM | POA: Diagnosis not present

## 2023-01-25 DIAGNOSIS — Z881 Allergy status to other antibiotic agents status: Secondary | ICD-10-CM | POA: Diagnosis not present

## 2023-01-25 DIAGNOSIS — R262 Difficulty in walking, not elsewhere classified: Secondary | ICD-10-CM | POA: Diagnosis not present

## 2023-01-25 DIAGNOSIS — Z66 Do not resuscitate: Secondary | ICD-10-CM | POA: Diagnosis present

## 2023-01-25 DIAGNOSIS — G309 Alzheimer's disease, unspecified: Secondary | ICD-10-CM | POA: Diagnosis not present

## 2023-01-25 DIAGNOSIS — E8809 Other disorders of plasma-protein metabolism, not elsewhere classified: Secondary | ICD-10-CM

## 2023-01-25 DIAGNOSIS — N390 Urinary tract infection, site not specified: Secondary | ICD-10-CM

## 2023-01-25 DIAGNOSIS — E782 Mixed hyperlipidemia: Secondary | ICD-10-CM

## 2023-01-25 DIAGNOSIS — E876 Hypokalemia: Secondary | ICD-10-CM

## 2023-01-25 DIAGNOSIS — I959 Hypotension, unspecified: Secondary | ICD-10-CM | POA: Diagnosis not present

## 2023-01-25 DIAGNOSIS — K8020 Calculus of gallbladder without cholecystitis without obstruction: Secondary | ICD-10-CM | POA: Diagnosis not present

## 2023-01-25 DIAGNOSIS — Z8781 Personal history of (healed) traumatic fracture: Secondary | ICD-10-CM

## 2023-01-25 DIAGNOSIS — R918 Other nonspecific abnormal finding of lung field: Secondary | ICD-10-CM | POA: Diagnosis not present

## 2023-01-25 DIAGNOSIS — R2681 Unsteadiness on feet: Secondary | ICD-10-CM | POA: Diagnosis not present

## 2023-01-25 DIAGNOSIS — I10 Essential (primary) hypertension: Secondary | ICD-10-CM

## 2023-01-25 DIAGNOSIS — Z1152 Encounter for screening for COVID-19: Secondary | ICD-10-CM

## 2023-01-25 DIAGNOSIS — N3000 Acute cystitis without hematuria: Secondary | ICD-10-CM | POA: Diagnosis present

## 2023-01-25 DIAGNOSIS — J9811 Atelectasis: Secondary | ICD-10-CM | POA: Diagnosis present

## 2023-01-25 DIAGNOSIS — R000 Tachycardia, unspecified: Secondary | ICD-10-CM | POA: Diagnosis not present

## 2023-01-25 DIAGNOSIS — E46 Unspecified protein-calorie malnutrition: Secondary | ICD-10-CM | POA: Diagnosis not present

## 2023-01-25 DIAGNOSIS — I2510 Atherosclerotic heart disease of native coronary artery without angina pectoris: Secondary | ICD-10-CM | POA: Diagnosis not present

## 2023-01-25 DIAGNOSIS — R0689 Other abnormalities of breathing: Secondary | ICD-10-CM | POA: Diagnosis not present

## 2023-01-25 DIAGNOSIS — R32 Unspecified urinary incontinence: Secondary | ICD-10-CM | POA: Diagnosis not present

## 2023-01-25 DIAGNOSIS — A4181 Sepsis due to Enterococcus: Secondary | ICD-10-CM | POA: Diagnosis not present

## 2023-01-25 DIAGNOSIS — N401 Enlarged prostate with lower urinary tract symptoms: Secondary | ICD-10-CM | POA: Diagnosis not present

## 2023-01-25 DIAGNOSIS — N3001 Acute cystitis with hematuria: Secondary | ICD-10-CM | POA: Diagnosis not present

## 2023-01-25 DIAGNOSIS — J984 Other disorders of lung: Secondary | ICD-10-CM | POA: Diagnosis not present

## 2023-01-25 DIAGNOSIS — S42302D Unspecified fracture of shaft of humerus, left arm, subsequent encounter for fracture with routine healing: Secondary | ICD-10-CM | POA: Diagnosis not present

## 2023-01-25 DIAGNOSIS — E440 Moderate protein-calorie malnutrition: Secondary | ICD-10-CM | POA: Diagnosis not present

## 2023-01-25 DIAGNOSIS — M6281 Muscle weakness (generalized): Secondary | ICD-10-CM | POA: Diagnosis not present

## 2023-01-25 DIAGNOSIS — M84422A Pathological fracture, left humerus, initial encounter for fracture: Secondary | ICD-10-CM | POA: Diagnosis not present

## 2023-01-25 LAB — PROTIME-INR
INR: 1.1 (ref 0.8–1.2)
Prothrombin Time: 14.3 seconds (ref 11.4–15.2)

## 2023-01-25 LAB — LACTIC ACID, PLASMA
Lactic Acid, Venous: 1.5 mmol/L (ref 0.5–1.9)
Lactic Acid, Venous: 0.9 mmol/L (ref 0.5–1.9)

## 2023-01-25 LAB — COMPREHENSIVE METABOLIC PANEL
ALT: 19 U/L (ref 0–44)
AST: 24 U/L (ref 15–41)
Albumin: 2.8 g/dL — ABNORMAL LOW (ref 3.5–5.0)
Alkaline Phosphatase: 84 U/L (ref 38–126)
Anion gap: 11 (ref 5–15)
BUN: 16 mg/dL (ref 8–23)
CO2: 24 mmol/L (ref 22–32)
Calcium: 8.4 mg/dL — ABNORMAL LOW (ref 8.9–10.3)
Chloride: 99 mmol/L (ref 98–111)
Creatinine, Ser: 0.87 mg/dL (ref 0.61–1.24)
GFR, Estimated: >60 mL/min (ref >60)
Glucose, Bld: 194 mg/dL — ABNORMAL HIGH (ref 70–99)
Potassium: 3.4 mmol/L — ABNORMAL LOW (ref 3.5–5.1)
Sodium: 134 mmol/L — ABNORMAL LOW (ref 135–145)
Total Bilirubin: 1 mg/dL (ref 0.3–1.2)
Total Protein: 6.7 g/dL (ref 6.5–8.1)

## 2023-01-25 LAB — CBC WITH DIFFERENTIAL/PLATELET
Abs Immature Granulocytes: 0.11 10*3/uL — ABNORMAL HIGH (ref 0.00–0.07)
Basophils Absolute: 0 10*3/uL (ref 0.0–0.1)
Basophils Relative: 0 %
Eosinophils Absolute: 0 10*3/uL (ref 0.0–0.5)
Eosinophils Relative: 0 %
HCT: 34.4 % — ABNORMAL LOW (ref 39.0–52.0)
Hemoglobin: 11.5 g/dL — ABNORMAL LOW (ref 13.0–17.0)
Immature Granulocytes: 1 %
Lymphocytes Relative: 3 %
Lymphs Abs: 0.6 10*3/uL — ABNORMAL LOW (ref 0.7–4.0)
MCH: 30.7 pg (ref 26.0–34.0)
MCHC: 33.4 g/dL (ref 30.0–36.0)
MCV: 92 fL (ref 80.0–100.0)
Monocytes Absolute: 1.2 10*3/uL — ABNORMAL HIGH (ref 0.1–1.0)
Monocytes Relative: 7 %
Neutro Abs: 15.1 10*3/uL — ABNORMAL HIGH (ref 1.7–7.7)
Neutrophils Relative %: 89 %
Platelets: 274 10*3/uL (ref 150–400)
RBC: 3.74 MIL/uL — ABNORMAL LOW (ref 4.22–5.81)
RDW: 15.2 % (ref 11.5–15.5)
WBC: 16.9 10*3/uL — ABNORMAL HIGH (ref 4.0–10.5)
nRBC: 0 % (ref 0.0–0.2)

## 2023-01-25 LAB — URINALYSIS, W/ REFLEX TO CULTURE (INFECTION SUSPECTED)
Bilirubin Urine: NEGATIVE
Glucose, UA: NEGATIVE mg/dL
Ketones, ur: NEGATIVE mg/dL
Nitrite: NEGATIVE
Protein, ur: 100 mg/dL — AB
Specific Gravity, Urine: 1.024 (ref 1.005–1.030)
WBC, UA: >50 WBC/hpf (ref 0–5)
pH: 5 (ref 5.0–8.0)

## 2023-01-25 LAB — RESP PANEL BY RT-PCR (RSV, FLU A&B, COVID)  RVPGX2
Influenza A by PCR: NEGATIVE
Influenza B by PCR: NEGATIVE
Resp Syncytial Virus by PCR: NEGATIVE
SARS Coronavirus 2 by RT PCR: NEGATIVE

## 2023-01-25 MED ORDER — ACETAMINOPHEN 500 MG PO TABS
1000.0000 mg | ORAL_TABLET | Freq: Once | ORAL | Status: AC
  Administered 2023-01-25: 1000 mg via ORAL
  Filled 2023-01-25: qty 2

## 2023-01-25 MED ORDER — LEVOFLOXACIN IN D5W 750 MG/150ML IV SOLN
750.0000 mg | Freq: Once | INTRAVENOUS | Status: AC
  Administered 2023-01-25: 750 mg via INTRAVENOUS
  Filled 2023-01-25: qty 150

## 2023-01-25 NOTE — ED Provider Notes (Signed)
[place] EMERGENCY DEPARTMENT AT [facility] Provider Note   CSN: [id number] Arrival date & time: 01/25/23  1750     History  Chief Complaint  Patient presents with   Fever    Brad Shaw is a 77 y.o. male.  HPI   Patient is a ill-appearing 77 year old male with a history of dementia, presenting to the hospital by EMS transport after being found to be febrile after 3 days of generally not feeling well.  Evidently there has been a temperature as high as 102.8, he was tachycardic for paramedics and they gave him a gram of Rocephin and route to the hospital.  There has been a recent history of a humeral fracture on the left over a month ago, he has healed well from that.  There was no report of coughing vomiting diarrhea dysuria or rashes.  The patient is unable to give any history secondary to dementia  Home Medications Prior to Admission medications   Medication Sig Start Date End Date Taking? Authorizing Provider  acetaminophen (TYLENOL) 500 MG tablet Take 500-1,000 mg by mouth every 12 (twelve) hours as needed for mild pain, moderate pain, fever or headache. 01/14/23  Yes [provider]  amLODipine (NORVASC) 10 MG tablet Take 10 mg by mouth daily. 07/15/18  Yes [provider]  atorvastatin (LIPITOR) 40 MG tablet Take 40 mg by mouth daily at 6 PM.  06/25/18  Yes [provider]  Cholecalciferol (VITAMIN D3) 50 MCG (2000 UT) TABS Take 50 mcg by mouth daily.   Yes [provider]  donepezil (ARICEPT) 10 MG tablet Take 1 at bedtime 07/22/21  Yes Penumalli, Vikram R, MD  finasteride (PROSCAR) 5 MG tablet Take 5 mg by mouth daily.  06/16/18  Yes [provider]  lisinopril (ZESTRIL) 20 MG tablet Take 20 mg by mouth daily. 02/07/20  Yes [provider]  Melatonin 5 MG CAPS Take 5 mg by mouth daily.   Yes [provider]  memantine (NAMENDA) 10 MG tablet Take 1 tablet (10 mg total) by mouth 2 (two) times daily. 07/22/21   Yes Penumalli, Glenford Bayley, MD  Multiple Vitamin (MULTI VITAMIN) TABS Take 1 tablet by mouth daily.   Yes [provider]  naproxen (NAPROSYN) 500 MG tablet Take 1 tablet (500 mg total) by mouth 2 (two) times daily. 12/20/22  Yes Kommor, Madison, MD  sertraline (ZOLOFT) 50 MG tablet Take 50 mg by mouth daily. 06/14/21  Yes [provider]  TURMERIC PO Take 1 capsule by mouth daily.   Yes [provider]      Allergies    Cefdinir    Review of Systems   Review of Systems  All other systems reviewed and are negative.   Physical Exam Updated Vital Signs BP 109/65   Pulse 81   Temp (!) 101.2 F (38.4 C) (Axillary)   Resp 15   Ht 1.829 m (6')   Wt 88.5 kg   SpO2 98%   BMI 26.45 kg/m  Physical Exam Vitals and nursing note reviewed.  Constitutional:      General: He is not in acute distress.    Appearance: He is well-developed.  HENT:     Head: Normocephalic and atraumatic.     Mouth/Throat:     Mouth: Mucous membranes are dry.     Pharynx: No oropharyngeal exudate.  Eyes:     General: No scleral icterus.       Right eye: No discharge.  Left eye: No discharge.     Conjunctiva/sclera: Conjunctivae normal.     Pupils: Pupils are equal, round, and reactive to light.  Neck:     Thyroid: No thyromegaly.     Vascular: No JVD.  Cardiovascular:     Rate and Rhythm: Regular rhythm. Tachycardia present.     Heart sounds: Normal heart sounds. No murmur heard.    No friction rub. No gallop.  Pulmonary:     Effort: Pulmonary effort is normal. No respiratory distress.     Breath sounds: Normal breath sounds. No wheezing or rales.  Abdominal:     General: Bowel sounds are normal. There is no distension.     Palpations: Abdomen is soft. There is no mass.     Tenderness: There is abdominal tenderness.  Musculoskeletal:        General: No tenderness. Normal range of motion.     Cervical back: Normal range of motion and neck supple.     Right lower leg:  No edema.     Left lower leg: No edema.  Lymphadenopathy:     Cervical: No cervical adenopathy.  Skin:    General: Skin is warm and dry.     Findings: No erythema or rash.  Neurological:     General: No focal deficit present.     Mental Status: He is alert.     Coordination: Coordination normal.  Psychiatric:        Behavior: Behavior normal.     ED Results / Procedures / Treatments   Labs (all labs ordered are listed, but only abnormal results are displayed) Labs Reviewed  COMPREHENSIVE METABOLIC PANEL - Abnormal; Notable for the following components:      Result Value   Sodium 134 (*)    Potassium 3.4 (*)    Glucose, Bld 194 (*)    Calcium 8.4 (*)    Albumin 2.8 (*)    All other components within normal limits  CBC WITH DIFFERENTIAL/PLATELET - Abnormal; Notable for the following components:   WBC 16.9 (*)    RBC 3.74 (*)    Hemoglobin 11.5 (*)    HCT 34.4 (*)    Neutro Abs 15.1 (*)    Lymphs Abs 0.6 (*)    Monocytes Absolute 1.2 (*)    Abs Immature Granulocytes 0.11 (*)    All other components within normal limits  URINALYSIS, W/ REFLEX TO CULTURE (INFECTION SUSPECTED) - Abnormal; Notable for the following components:   Color, Urine AMBER (*)    APPearance CLOUDY (*)    Hgb urine dipstick MODERATE (*)    Protein, ur 100 (*)    Leukocytes,Ua MODERATE (*)    Bacteria, UA RARE (*)    All other components within normal limits  CULTURE, BLOOD (ROUTINE X 2)  CULTURE, BLOOD (ROUTINE X 2)  RESP PANEL BY RT-PCR (RSV, FLU A&B, COVID)  RVPGX2  URINE CULTURE  LACTIC ACID, PLASMA  LACTIC ACID, PLASMA  PROTIME-INR    EKG EKG Interpretation Date/Time:  Sunday January 25 2023 18:33:58 EDT Ventricular Rate:  112 PR Interval:  144 QRS Duration:  84 QT Interval:  300 QTC Calculation: 410 R Axis:   21  Text Interpretation: Sinus tachycardia Confirmed by Eber Hong ([id number]) on 01/25/2023 6:42:29 PM  Radiology DG Chest Port 1 View  Result Date: 01/25/2023 CLINICAL  DATA:  Sepsis and fevers EXAM: PORTABLE CHEST 1 VIEW COMPARISON:  07/02/2017 FINDINGS: Cardiac shadow is prominent although extension weighted by the portable technique. Lungs are  well aerated without focal infiltrate or effusion. Impacted proximal left humeral fracture is noted stable in appearance from a prior exam from 12/20/2022. No acute bony abnormality is noted. IMPRESSION: Chronic impacted left humeral fracture. No other focal abnormality is noted. Electronically Signed   By: Alcide Clever M.D.   On: 01/25/2023 19:49    Procedures Procedures    Medications Ordered in ED Medications  levofloxacin (LEVAQUIN) IVPB 750 mg (has no administration in time range)  acetaminophen (TYLENOL) tablet 1,000 mg (1,000 mg Oral Given 01/25/23 1851)    ED Course/ Medical Decision Making/ A&P                                 Medical Decision Making Amount and/or Complexity of Data Reviewed Labs: ordered. Radiology: ordered.  Risk OTC drugs. Prescription drug management. Decision regarding hospitalization.    This patient presents to the ED for concern of fever and tachycardia, this involves an extensive number of treatment options, and is a complaint that carries with it a high risk of complications and morbidity.  The differential diagnosis includes likely sepsis, could be intra-abdominal, pulmonary or some other source, he does have mild tenderness in the lower abdomen   Co morbidities that complicate the patient evaluation  History of hypertension, Alzheimer's, BPH, cholesterol   Additional history obtained:  Additional history obtained from medical record, he had been seen in the ER about a month ago for the fall with a shoulder injury, has been followed by his family doctor in the outpatient setting for prostate cancer with urology, dementia with neurology, no other recent admissions to the hospital External records from outside source obtained and reviewed including prior medical  record, shows that the patient had been seen a month ago for his arm fracture, has been seen by neurology and psychiatry, followed for his urologic history as well   Lab Tests:  I Ordered, and personally interpreted labs.  The pertinent results include: CBC shows leukocytosis, no renal dysfunction, no COVID, urinalysis does reveal urinary tract infection   Imaging Studies ordered:  I ordered imaging studies including chest x-ray I independently visualized and interpreted imaging which showed no acute infiltrates I agree with the radiologist interpretation   Cardiac Monitoring: / EKG:  The patient was maintained on a cardiac monitor.  I personally viewed and interpreted the cardiac monitored which showed an underlying rhythm of: Initially tachycardic but this improved, fever also defervesced with medicine   Consultations Obtained:  I requested consultation with the hospitalist,  and discussed lab and imaging findings as well as pertinent plan - they recommend: Admit to hospital   Problem List / ED Course / Critical interventions / Medication management  Given that the patient has a leukocytosis and a fever and a source of infection he meets criteria for sepsis.  He does not appear to be in septic shock, antibiotics will be ordered I ordered medication including levofloxacin given his antibiotic allergy to cephalosporins for urinary tract infection and sepsis Reevaluation of the patient after these medicines showed that the patient improved I have reviewed the patients home medicines and have made adjustments as needed   Social Determinants of Health:  Critically ill, dementia   Test / Admission - Considered:  Will admit to higher level of care         Final Clinical Impression(s) / ED Diagnoses Final diagnoses:  Sepsis without acute organ dysfunction, due to unspecified  organism [facility])  Acute cystitis without hematuria    Rx / DC Orders ED Discharge Orders      None         Eber Hong, MD 01/25/23 2236

## 2023-01-25 NOTE — ED Notes (Signed)
When placing patient on 12 lead this RN noticed bruising to left chest and left arm. Wife at bedside reports patient had tylenol this morning and ibuprofen this afternoon. Wife states that patient had a fall and was seen here July 20th for broken left arm states orthopedics said he would heal at home, pt was not admitted at that time.

## 2023-01-25 NOTE — ED Triage Notes (Signed)
Pt arrives from home where he lives with wife who reports to EMS that patient has been sick since Thursday and not getting any better. EMS reports fever, weakness, and chills. Started IV and gave 1 L NS as well as 1 g Rocephin PTA. ETCO2 28

## 2023-01-25 NOTE — H&P (Signed)
History and Physical    Patient: Brad Shaw QMV:[id number] DOB: Jul 08, 1945 DOA: 01/25/2023 DOS: the patient was seen and examined on 01/26/2023 PCP: Joycelyn Rua, MD  Patient coming from: Home  Chief Complaint:  Chief Complaint  Patient presents with   Fever   HPI: Brad Shaw is a 77 y.o. male with medical history significant of hypertension, hyperlipidemia, Alzheimer's disease, urinary incontinence who presents to the emergency department via EMS from home due to 3 days of not feeling well.  Patient was unable to provide history possibly due to dementia, history was provided by EDP and wife at bedside.  Per report, patient had about 4 episodes of nonbloody vomiting about 3 days ago which self resolved.  Patient started to have fever 2 days ago, this was controlled with Tylenol and intermittent Motrin.  He has become weaker and was only able to tolerate minimal amount of juice as meals.  Fever went up to 102.84F today, this was associated with chills, so EMS was activated.  On arrival of EMS team, 1 g of IV Rocephin was given en route to the hospital.  There was no report of chest pain, shortness of breath, coughing, vomiting, diarrhea.  Last bowel movement was 2 days ago. Patient presented to the ED on 12/20/2022 due to left humeral fracture, he was placed in a sling and swath and was asked to follow-up with orthopedic surgeon as an outpatient.  ED Course:  In the emergency department, temperature was 103F, respiratory rate was 25/minute, pulse 108 bpm, O2 sat 94% on supplemental oxygen at 3 LPM.  Workup in the ED showed WBC 16.9, hemoglobin 11.5, hematocrit 34.4, MCV 92.0, platelets 274.  BMP was normal except for sodium of 134, potassium 3.4, blood glucose 194, albumin 2.8.  Lactic acid x 2 was normal, urinalysis was positive for moderate leukocytes, proteinuria, greater than 50 WBC/Hpf, RBC 21-50 RBC/hpf.  Influenza A, B, SARS coronavirus 2, RSV was negative.  Blood culture was  pending. Chest x-ray showed chronic impacted left humeral fracture.  No other focal abnormalities noted.  Review of Systems: Review of systems as noted in the HPI. All other systems reviewed and are negative.   Past Medical History:  Diagnosis Date   Alzheimer disease (HCC) 01/17/2021   BPH (benign prostatic hyperplasia)    High cholesterol    Hypertension    Prostate cancer [facility])    Past Surgical History:  Procedure Laterality Date   TONSILLECTOMY      Social History:  reports that he has never smoked. He has never used smokeless tobacco. He reports current alcohol use. He reports that he does not use drugs.   Allergies  Allergen Reactions   Cefdinir Rash    Family History  Problem Relation Age of Onset   Cirrhosis Mother    Heart disease Father      Prior to Admission medications   Medication Sig Start Date End Date Taking? Authorizing Provider  acetaminophen (TYLENOL) 500 MG tablet Take 500-1,000 mg by mouth every 12 (twelve) hours as needed for mild pain, moderate pain, fever or headache. 01/14/23  Yes [provider]  amLODipine (NORVASC) 10 MG tablet Take 10 mg by mouth daily. 07/15/18  Yes [provider]  atorvastatin (LIPITOR) 40 MG tablet Take 40 mg by mouth daily at 6 PM.  06/25/18  Yes [provider]  Cholecalciferol (VITAMIN D3) 50 MCG (2000 UT) TABS Take 50 mcg by mouth daily.   Yes [provider]  donepezil (ARICEPT) 10  MG tablet Take 1 at bedtime 07/22/21  Yes Penumalli, Vikram R, MD  finasteride (PROSCAR) 5 MG tablet Take 5 mg by mouth daily.  06/16/18  Yes [provider]  lisinopril (ZESTRIL) 20 MG tablet Take 20 mg by mouth daily. 02/07/20  Yes [provider]  Melatonin 5 MG CAPS Take 5 mg by mouth daily.   Yes [provider]  memantine (NAMENDA) 10 MG tablet Take 1 tablet (10 mg total) by mouth 2 (two) times daily. 07/22/21  Yes Penumalli, Glenford Bayley, MD  Multiple Vitamin (MULTI VITAMIN) TABS  Take 1 tablet by mouth daily.   Yes [provider]  naproxen (NAPROSYN) 500 MG tablet Take 1 tablet (500 mg total) by mouth 2 (two) times daily. 12/20/22  Yes Kommor, Madison, MD  sertraline (ZOLOFT) 50 MG tablet Take 50 mg by mouth daily. 06/14/21  Yes [provider]  TURMERIC PO Take 1 capsule by mouth daily.   Yes [provider]    Physical Exam: BP 117/64   Pulse (!) 105   Temp 99.2 F (37.3 C) (Oral)   Resp 20   Ht 6' (1.829 m)   Wt 88.5 kg   SpO2 97%   BMI 26.45 kg/m   General: 77 y.o. year-old male ill appearing, but in no acute distress.  Somnolent, though easily arousable.  HEENT: NCAT, EOMI, dry mucous membrane Neck: Supple, trachea medial Cardiovascular: Tachycardia.  Regular rate and rhythm with no rubs or gallops.  No thyromegaly or JVD noted.  No lower extremity edema. 2/4 pulses in all 4 extremities. Respiratory: Clear to auscultation with no wheezes or rales. Good inspiratory effort. Abdomen: Soft, nontender nondistended with normal bowel sounds x4 quadrants. Muskuloskeletal: No cyanosis, clubbing or edema noted bilaterally Neuro: CN II-XII intact, strength 5/5 x 4, sensation, reflexes intact Skin: No ulcerative lesions noted or rashes Psychiatry: Judgement and insight appear normal. Mood is appropriate for condition and setting          Labs on Admission:  Basic Metabolic Panel: Recent Labs  Lab 01/25/23 1839  NA 134*  K 3.4*  CL 99  CO2 24  GLUCOSE 194*  BUN 16  CREATININE 0.87  CALCIUM 8.4*   Liver Function Tests: Recent Labs  Lab 01/25/23 1839  AST 24  ALT 19  ALKPHOS 84  BILITOT 1.0  PROT 6.7  ALBUMIN 2.8*   No results for input(s): "LIPASE", "AMYLASE" in the last 168 hours. No results for input(s): "AMMONIA" in the last 168 hours. CBC: Recent Labs  Lab 01/25/23 1839  WBC 16.9*  NEUTROABS 15.1*  HGB 11.5*  HCT 34.4*  MCV 92.0  PLT 274   Cardiac Enzymes: No results for input(s): "CKTOTAL", "CKMB",  "CKMBINDEX", "TROPONINI" in the last 168 hours.  BNP (last 3 results) No results for input(s): "BNP" in the last 8760 hours.  ProBNP (last 3 results) No results for input(s): "PROBNP" in the last 8760 hours.  CBG: No results for input(s): "GLUCAP" in the last 168 hours.  Radiological Exams on Admission: DG Chest Port 1 View  Result Date: 01/25/2023 CLINICAL DATA:  Sepsis and fevers EXAM: PORTABLE CHEST 1 VIEW COMPARISON:  07/02/2017 FINDINGS: Cardiac shadow is prominent although extension weighted by the portable technique. Lungs are well aerated without focal infiltrate or effusion. Impacted proximal left humeral fracture is noted stable in appearance from a prior exam from 12/20/2022. No acute bony abnormality is noted. IMPRESSION: Chronic impacted left humeral fracture. No other focal abnormality is noted. Electronically Signed  By: Alcide Clever M.D.   On: 01/25/2023 19:49    EKG: I independently viewed the EKG done and my findings are as followed: Sinus tachycardia at a rate of 102 bpm  Assessment/Plan Present on Admission:  Sepsis due to urinary tract infection (HCC)  Alzheimer disease (HCC)  Principal Problem:   Sepsis due to urinary tract infection (HCC) Active Problems:   Alzheimer disease (HCC)   Hypokalemia   Hyperglycemia   Essential hypertension   Mixed hyperlipidemia   Hypoalbuminemia due to protein-calorie malnutrition (HCC)  Sepsis secondary to presumed UTI POA Patient met sepsis criteria due to being tachycardic, tachypneic and having leukocytosis (WBC > 12.0).  UTI was suspected as source of infection. Patient was started on IV levofloxacin, we shall continue same at this time Continue IV hydration Continue Tylenol as needed Procalcitonin pending Blood culture and urine culture pending Continue aspiration precautions  Hypoalbuminemia possibly secondary to moderate protein calorie malnutrition Albumin 2.8, continue supplement will be  provided  Hypokalemia K+ 3.4, this will be replenished  Hyperglycemia CBG 194, patient has no history of T2DM Continue CBG and hypoglycemia protocol Hemoglobin A1c will be checked  Essential hypertension BP meds will be held at this time due to soft BP  Mixed hyperlipidemia Continue Lipitor  Alzheimer's disease Continue Aricept and Namenda   DVT prophylaxis: Lovenox  Advance Care Planning: DNR (confirmed by wife)  Consults: None  Family Communication: Wife at bedside (all questions answered to satisfaction)  Severity of Illness: The appropriate patient status for this patient is INPATIENT. Inpatient status is judged to be reasonable and necessary in order to provide the required intensity of service to ensure the patient's safety. The patient's presenting symptoms, physical exam findings, and initial radiographic and laboratory data in the context of their chronic comorbidities is felt to place them at high risk for further clinical deterioration. Furthermore, it is not anticipated that the patient will be medically stable for discharge from the hospital within 2 midnights of admission.   * I certify that at the point of admission it is my clinical judgment that the patient will require inpatient hospital care spanning beyond 2 midnights from the point of admission due to high intensity of service, high risk for further deterioration and high frequency of surveillance required.*  Author: Frankey Shown, DO 01/26/2023 1:27 AM  For on call review [email or web address].

## 2023-01-26 ENCOUNTER — Inpatient Hospital Stay (HOSPITAL_COMMUNITY): Payer: Medicare Other

## 2023-01-26 DIAGNOSIS — N3001 Acute cystitis with hematuria: Secondary | ICD-10-CM | POA: Diagnosis not present

## 2023-01-26 DIAGNOSIS — A419 Sepsis, unspecified organism: Secondary | ICD-10-CM | POA: Insufficient documentation

## 2023-01-26 DIAGNOSIS — R739 Hyperglycemia, unspecified: Secondary | ICD-10-CM | POA: Insufficient documentation

## 2023-01-26 DIAGNOSIS — I10 Essential (primary) hypertension: Secondary | ICD-10-CM | POA: Diagnosis not present

## 2023-01-26 DIAGNOSIS — F0390 Unspecified dementia without behavioral disturbance: Secondary | ICD-10-CM | POA: Diagnosis not present

## 2023-01-26 DIAGNOSIS — E782 Mixed hyperlipidemia: Secondary | ICD-10-CM | POA: Insufficient documentation

## 2023-01-26 DIAGNOSIS — N390 Urinary tract infection, site not specified: Secondary | ICD-10-CM | POA: Insufficient documentation

## 2023-01-26 DIAGNOSIS — E8809 Other disorders of plasma-protein metabolism, not elsewhere classified: Secondary | ICD-10-CM | POA: Insufficient documentation

## 2023-01-26 DIAGNOSIS — E876 Hypokalemia: Secondary | ICD-10-CM | POA: Insufficient documentation

## 2023-01-26 LAB — COMPREHENSIVE METABOLIC PANEL
ALT: 17 U/L (ref 0–44)
AST: 23 U/L (ref 15–41)
Albumin: 2.5 g/dL — ABNORMAL LOW (ref 3.5–5.0)
Alkaline Phosphatase: 75 U/L (ref 38–126)
Anion gap: 9 (ref 5–15)
BUN: 16 mg/dL (ref 8–23)
CO2: 23 mmol/L (ref 22–32)
Calcium: 8.4 mg/dL — ABNORMAL LOW (ref 8.9–10.3)
Chloride: 104 mmol/L (ref 98–111)
Creatinine, Ser: 0.81 mg/dL (ref 0.61–1.24)
GFR, Estimated: >60 mL/min (ref >60)
Glucose, Bld: 163 mg/dL — ABNORMAL HIGH (ref 70–99)
Potassium: 3.7 mmol/L (ref 3.5–5.1)
Sodium: 136 mmol/L (ref 135–145)
Total Bilirubin: 0.8 mg/dL (ref 0.3–1.2)
Total Protein: 6.3 g/dL — ABNORMAL LOW (ref 6.5–8.1)

## 2023-01-26 LAB — MAGNESIUM: Magnesium: 1.8 mg/dL (ref 1.7–2.4)

## 2023-01-26 LAB — CBC
HCT: 32.9 % — ABNORMAL LOW (ref 39.0–52.0)
Hemoglobin: 10.9 g/dL — ABNORMAL LOW (ref 13.0–17.0)
MCH: 30.8 pg (ref 26.0–34.0)
MCHC: 33.1 g/dL (ref 30.0–36.0)
MCV: 92.9 fL (ref 80.0–100.0)
Platelets: 248 10*3/uL (ref 150–400)
RBC: 3.54 MIL/uL — ABNORMAL LOW (ref 4.22–5.81)
RDW: 15.1 % (ref 11.5–15.5)
WBC: 15.7 10*3/uL — ABNORMAL HIGH (ref 4.0–10.5)
nRBC: 0 % (ref 0.0–0.2)

## 2023-01-26 LAB — GLUCOSE, CAPILLARY
Glucose-Capillary: 146 mg/dL — ABNORMAL HIGH (ref 70–99)
Glucose-Capillary: 152 mg/dL — ABNORMAL HIGH (ref 70–99)
Glucose-Capillary: 156 mg/dL — ABNORMAL HIGH (ref 70–99)

## 2023-01-26 LAB — PROCALCITONIN: Procalcitonin: 1.18 ng/mL

## 2023-01-26 LAB — HEMOGLOBIN A1C
Hgb A1c MFr Bld: 6.4 % — ABNORMAL HIGH (ref 4.8–5.6)
Mean Plasma Glucose: 136.98 mg/dL

## 2023-01-26 LAB — CBG MONITORING, ED: Glucose-Capillary: 149 mg/dL — ABNORMAL HIGH (ref 70–99)

## 2023-01-26 LAB — PHOSPHORUS: Phosphorus: 2.4 mg/dL — ABNORMAL LOW (ref 2.5–4.6)

## 2023-01-26 MED ORDER — SERTRALINE HCL 50 MG PO TABS
50.0000 mg | ORAL_TABLET | Freq: Every day | ORAL | Status: DC
  Administered 2023-01-26 – 2023-01-29 (×4): 50 mg via ORAL
  Filled 2023-01-26 (×4): qty 1

## 2023-01-26 MED ORDER — ONDANSETRON HCL 4 MG PO TABS: 4.0000 mg | ORAL_TABLET | Freq: Four times a day (QID) | ORAL | Status: DC | PRN

## 2023-01-26 MED ORDER — POTASSIUM CHLORIDE 10 MEQ/100ML IV SOLN
10.0000 meq | INTRAVENOUS | Status: AC
  Administered 2023-01-26 (×3): 10 meq via INTRAVENOUS
  Filled 2023-01-26 (×3): qty 100

## 2023-01-26 MED ORDER — GLUCERNA SHAKE PO LIQD
237.0000 mL | Freq: Three times a day (TID) | ORAL | Status: DC
  Administered 2023-01-26 – 2023-01-29 (×9): 237 mL via ORAL

## 2023-01-26 MED ORDER — ENOXAPARIN SODIUM 40 MG/0.4ML IJ SOSY
40.0000 mg | PREFILLED_SYRINGE | Freq: Every day | INTRAMUSCULAR | Status: DC
  Administered 2023-01-26 – 2023-01-29 (×4): 40 mg via SUBCUTANEOUS
  Filled 2023-01-26 (×4): qty 0.4

## 2023-01-26 MED ORDER — LEVOFLOXACIN IN D5W 750 MG/150ML IV SOLN: 750.0000 mg | INTRAVENOUS | Status: DC

## 2023-01-26 MED ORDER — FINASTERIDE 5 MG PO TABS
5.0000 mg | ORAL_TABLET | Freq: Every day | ORAL | Status: DC
  Administered 2023-01-26 – 2023-01-29 (×4): 5 mg via ORAL
  Filled 2023-01-26 (×4): qty 1

## 2023-01-26 MED ORDER — SODIUM CHLORIDE 0.9 % IV SOLN
1.0000 g | Freq: Three times a day (TID) | INTRAVENOUS | Status: DC
  Administered 2023-01-26 – 2023-01-28 (×6): 1 g via INTRAVENOUS
  Filled 2023-01-26 (×6): qty 20

## 2023-01-26 MED ORDER — ACETAMINOPHEN 325 MG PO TABS
650.0000 mg | ORAL_TABLET | Freq: Four times a day (QID) | ORAL | Status: DC | PRN
  Administered 2023-01-26 (×2): 650 mg via ORAL
  Filled 2023-01-26 (×3): qty 2

## 2023-01-26 MED ORDER — DONEPEZIL HCL 5 MG PO TABS
10.0000 mg | ORAL_TABLET | Freq: Every day | ORAL | Status: DC
  Administered 2023-01-26 – 2023-01-28 (×3): 10 mg via ORAL
  Filled 2023-01-26 (×3): qty 2

## 2023-01-26 MED ORDER — MEMANTINE HCL 10 MG PO TABS
10.0000 mg | ORAL_TABLET | Freq: Two times a day (BID) | ORAL | Status: DC
  Administered 2023-01-26 – 2023-01-29 (×7): 10 mg via ORAL
  Filled 2023-01-26 (×7): qty 1

## 2023-01-26 MED ORDER — ACETAMINOPHEN 650 MG RE SUPP: 650.0000 mg | Freq: Four times a day (QID) | RECTAL | Status: DC | PRN

## 2023-01-26 MED ORDER — ATORVASTATIN CALCIUM 40 MG PO TABS
40.0000 mg | ORAL_TABLET | Freq: Every day | ORAL | Status: DC
  Administered 2023-01-26 – 2023-01-28 (×3): 40 mg via ORAL
  Filled 2023-01-26 (×3): qty 1

## 2023-01-26 MED ORDER — K PHOS MONO-SOD PHOS DI & MONO 155-852-130 MG PO TABS
500.0000 mg | ORAL_TABLET | Freq: Two times a day (BID) | ORAL | Status: DC
  Administered 2023-01-26 – 2023-01-29 (×7): 500 mg via ORAL
  Filled 2023-01-26 (×12): qty 2

## 2023-01-26 MED ORDER — ONDANSETRON HCL 4 MG/2ML IJ SOLN
4.0000 mg | Freq: Four times a day (QID) | INTRAMUSCULAR | Status: DC | PRN
  Administered 2023-01-27: 4 mg via INTRAVENOUS
  Filled 2023-01-26: qty 2

## 2023-01-26 NOTE — Hospital Course (Addendum)
77 year old male with a history of hypertension, hyperlipidemia, Alzheimer's dementia, prostate cancer, and urinary incontinence presenting with generalized weakness, malaise, and vomiting.  The patient had 4 episodes of nausea and vomiting on 01/22/2023.  This seems to have resolved without any further episodes.  There was no hematemesis.  On 01/23/2023, the patient and spouse noted the patient had decreased oral intake and generalized weakness.  This had progressed over the next 2 days.  She noted that he was a little bit more sleepy than usual and had difficulty getting in and out of bed secondary to his generalized weakness.  She noted that the patient began developing a fever up to 101.0 F on 01/24/2023.  This continued throughout the day.  His fever was up to 103.0 F on the morning of 01/25/2023.  As result, the patient was brought to emergency department for further evaluation treatment.  There has been no complaints of chest pain, shortness breath, coughing, hematemesis, diarrhea, abdominal pain.  There is no new medications. In the ED, the patient was febrile up to 103.0 F.  He was tachycardic up to 111, but remained hemodynamically stable.  Oxygen saturation was 87% on room air.  The patient was placed on supplemental oxygen at 2 L with saturation up to 97%.  WBC 16.9, hemoglobin 11.5, platelets 274,000.  Sodium 136, potassium 3.7, bicarbonate 23, serum creatinine 0.87.  LFTs were unremarkable.  Chest x-ray was negative for infiltrates.  UA>50 WBC.  Lactic acid peaked at 1.5.  COVID-19 PCR was negative.  Blood and urine cultures were sent.  The patient was started on levofloxacin.

## 2023-01-26 NOTE — ED Notes (Signed)
ED TO INPATIENT HANDOFF REPORT  ED Nurse Name and Phone #: [phone]  S Name/Age/Gender Brad Shaw 77 y.o. male Room/Bed: APA12/APA12  Code Status   Code Status: DNR  Home   Is this baseline?   Triage Complete: Triage complete  Chief Complaint Sepsis due to urinary tract infection (HCC) [A41.9, N39.0]  Triage Note Pt arrives from home where he lives with wife who reports to EMS that patient has been sick since Thursday and not getting any better. EMS reports fever, weakness, and chills. Started IV and gave 1 L NS as well as 1 g Rocephin PTA. ETCO2 28   Allergies Allergies  Allergen Reactions   Cefdinir Rash    Level of Care/Admitting Diagnosis ED Disposition     ED Disposition  Admit   Condition  --   Comment  Hospital Area: [facility] [[id number]]  Level of Care: Med-Surg [16]  Covid Evaluation: Asymptomatic - no recent exposure (last 10 days) testing not required  Diagnosis: Sepsis due to urinary tract infection [facility]) [[id number]]  Admitting Physician: Frankey Shown [[id number]]  Attending Physician: Frankey Shown [[id number]]  Certification:: I certify this patient will need inpatient services for at least 2 midnights  Expected Medical Readiness: 01/28/2023          B Medical/Surgery History Past Medical History:  Diagnosis Date   Alzheimer disease (HCC) 01/17/2021   BPH (benign prostatic hyperplasia)    High cholesterol    Hypertension    Prostate cancer [facility])    Past Surgical History:  Procedure Laterality Date   TONSILLECTOMY       A IV Location/Drains/Wounds Patient Lines/Drains/Airways Status     Active Line/Drains/Airways     Name Placement date Placement time Site Days   Peripheral IV 01/25/23 18 G Right Antecubital 01/25/23  1821  Antecubital  1            Intake/Output Last 24 hours  Intake/Output Summary (Last 24 hours) at 01/26/2023 0906 Last data filed at 01/26/2023 0709 Gross per 24 hour  Intake 1281.76 ml  Output  100 ml  Net 1181.76 ml    Labs/Imaging Results for orders placed or performed during the hospital encounter of 01/25/23 (from the past 48 hour(s))  Comprehensive metabolic panel     Status: Abnormal   Collection Time: 01/25/23  6:39 PM  Result Value Ref Range   Sodium 134 (L) 135 - 145 mmol/L   Potassium 3.4 (L) 3.5 - 5.1 mmol/L   Chloride 99 98 - 111 mmol/L   CO2 24 22 - 32 mmol/L   Glucose, Bld 194 (H) 70 - 99 mg/dL    Comment: Glucose reference range applies only to samples taken after fasting for at least 8 hours.   BUN 16 8 - 23 mg/dL   Creatinine, Ser 2.13 0.61 - 1.24 mg/dL   Calcium 8.4 (L) 8.9 - 10.3 mg/dL   Total Protein 6.7 6.5 - 8.1 g/dL   Albumin 2.8 (L) 3.5 - 5.0 g/dL   AST 24 15 - 41 U/L   ALT 19 0 - 44 U/L   Alkaline Phosphatase 84 38 - 126 U/L   Total Bilirubin 1.0 0.3 - 1.2 mg/dL   GFR, Estimated >08 >65 mL/min    Comment: (NOTE) Calculated using the CKD-EPI Creatinine Equation (2021)    Anion gap 11 5 - 15    Comment: Performed at [facility], [address], [place], [place] [address]  Lactic acid, plasma     Status: None  Collection Time: 01/25/23  6:39 PM  Result Value Ref Range   Lactic Acid, Venous 1.5 0.5 - 1.9 mmol/L    Comment: Performed at [facility], [address], [place], [place] [address]  CBC with Differential     Status: Abnormal   Collection Time: 01/25/23  6:39 PM  Result Value Ref Range   WBC 16.9 (H) 4.0 - 10.5 K/uL   RBC 3.74 (L) 4.22 - 5.81 MIL/uL   Hemoglobin 11.5 (L) 13.0 - 17.0 g/dL   HCT 27.2 (L) 53.6 - 64.4 %   MCV 92.0 80.0 - 100.0 fL   MCH 30.7 26.0 - 34.0 pg   MCHC 33.4 30.0 - 36.0 g/dL   RDW 03.4 74.2 - 59.5 %   Platelets 274 150 - 400 K/uL   nRBC 0.0 0.0 - 0.2 %   Neutrophils Relative % 89 %   Neutro Abs 15.1 (H) 1.7 - 7.7 K/uL   Lymphocytes Relative 3 %   Lymphs Abs 0.6 (L) 0.7 - 4.0 K/uL   Monocytes Relative 7 %   Monocytes Absolute 1.2 (H) 0.1 - 1.0 K/uL   Eosinophils Relative 0 %   Eosinophils Absolute 0.0  0.0 - 0.5 K/uL   Basophils Relative 0 %   Basophils Absolute 0.0 0.0 - 0.1 K/uL   Immature Granulocytes 1 %   Abs Immature Granulocytes 0.11 (H) 0.00 - 0.07 K/uL    Comment: Performed at [facility], [address], [place], [place] [address]  Protime-INR     Status: None   Collection Time: 01/25/23  6:39 PM  Result Value Ref Range   Prothrombin Time 14.3 11.4 - 15.2 seconds   INR 1.1 0.8 - 1.2    Comment: (NOTE) INR goal varies based on device and disease states. Performed at [facility], [address], [place], [place] [address]   Culture, blood (Routine x 2)     Status: None (Preliminary result)   Collection Time: 01/25/23  6:50 PM   Specimen: BLOOD RIGHT HAND  Result Value Ref Range   Specimen Description BLOOD RIGHT HAND BLOOD    Special Requests      BOTTLES DRAWN AEROBIC AND ANAEROBIC Blood Culture results may not be optimal due to an excessive volume of blood received in culture bottles   Culture      NO GROWTH < 12 HOURS Performed at [facility], [address], [place], [place] [address]    Report Status PENDING   Culture, blood (Routine x 2)     Status: None (Preliminary result)   Collection Time: 01/25/23  7:05 PM   Specimen: BLOOD LEFT HAND  Result Value Ref Range   Specimen Description BLOOD LEFT HAND BLOOD    Special Requests      BOTTLES DRAWN AEROBIC ONLY Blood Culture adequate volume   Culture      NO GROWTH < 12 HOURS Performed at [facility], [address] [place], [place] [address]    Report Status PENDING   Lactic acid, plasma     Status: None   Collection Time: 01/25/23  8:27 PM  Result Value Ref Range   Lactic Acid, Venous 0.9 0.5 - 1.9 mmol/L    Comment: Performed at [facility], [address] [place], [place] [address]  Resp panel by RT-PCR (RSV, Flu A&B, Covid) Anterior Nasal Swab     Status: None   Collection Time: 01/25/23  8:31 PM   Specimen: Anterior Nasal Swab  Result Value Ref Range  SARS Coronavirus 2 by RT PCR NEGATIVE NEGATIVE     Comment: (NOTE) SARS-CoV-2 target nucleic acids are NOT DETECTED.  The SARS-CoV-2 RNA is generally detectable in upper respiratory specimens during the acute phase of infection. The lowest concentration of SARS-CoV-2 viral copies this assay can detect is 138 copies/mL. A negative result does not preclude SARS-Cov-2 infection and should not be used as the sole basis for treatment or other patient management decisions. A negative result may occur with  improper specimen collection/handling, submission of specimen other than nasopharyngeal swab, presence of viral mutation(s) within the areas targeted by this assay, and inadequate number of viral copies(<138 copies/mL). A negative result must be combined with clinical observations, patient history, and epidemiological information. The expected result is Negative.  Fact Sheet for Patients:  BloggerCourse.com  Fact Sheet for Healthcare Providers:  SeriousBroker.it  This test is no t yet approved or cleared by the [place] FDA and  has been authorized for detection and/or diagnosis of SARS-CoV-2 by FDA under an Emergency Use Authorization (EUA). This EUA will remain  in effect (meaning this test can be used) for the duration of the COVID-19 declaration under Section 564(b)(1) of the Act, 21 U.S.C.section 360bbb-3(b)(1), unless the authorization is terminated  or revoked sooner.       Influenza A by PCR NEGATIVE NEGATIVE   Influenza B by PCR NEGATIVE NEGATIVE    Comment: (NOTE) The Xpert Xpress SARS-CoV-2/FLU/RSV plus assay is intended as an aid in the diagnosis of influenza from Nasopharyngeal swab specimens and should not be used as a sole basis for treatment. Nasal washings and aspirates are unacceptable for Xpert Xpress SARS-CoV-2/FLU/RSV testing.  Fact Sheet for Patients: BloggerCourse.com  Fact Sheet for Healthcare  Providers: SeriousBroker.it  This test is not yet approved or cleared by the [place] FDA and has been authorized for detection and/or diagnosis of SARS-CoV-2 by FDA under an Emergency Use Authorization (EUA). This EUA will remain in effect (meaning this test can be used) for the duration of the COVID-19 declaration under Section 564(b)(1) of the Act, 21 U.S.C. section 360bbb-3(b)(1), unless the authorization is terminated or revoked.     Resp Syncytial Virus by PCR NEGATIVE NEGATIVE    Comment: (NOTE) Fact Sheet for Patients: BloggerCourse.com  Fact Sheet for Healthcare Providers: SeriousBroker.it  This test is not yet approved or cleared by the [place] FDA and has been authorized for detection and/or diagnosis of SARS-CoV-2 by FDA under an Emergency Use Authorization (EUA). This EUA will remain in effect (meaning this test can be used) for the duration of the COVID-19 declaration under Section 564(b)(1) of the Act, 21 U.S.C. section 360bbb-3(b)(1), unless the authorization is terminated or revoked.  Performed at [facility], [address], [place], [place] [address]   Urinalysis, w/ Reflex to Culture (Infection Suspected) -Urine, Clean Catch     Status: Abnormal   Collection Time: 01/25/23  9:50 PM  Result Value Ref Range   Specimen Source URINE, CATHETERIZED    Color, Urine AMBER (A) YELLOW    Comment: BIOCHEMICALS MAY BE AFFECTED BY COLOR   APPearance CLOUDY (A) CLEAR   Specific Gravity, Urine 1.024 1.005 - 1.030   pH 5.0 5.0 - 8.0   Glucose, UA NEGATIVE NEGATIVE mg/dL   Hgb urine dipstick MODERATE (A) NEGATIVE   Bilirubin Urine NEGATIVE NEGATIVE   Ketones, ur NEGATIVE NEGATIVE mg/dL   Protein, ur 295 (A) NEGATIVE mg/dL   Nitrite NEGATIVE NEGATIVE   Leukocytes,Ua MODERATE (A) NEGATIVE  RBC / HPF 21-50 0 - 5 RBC/hpf   WBC, UA >50 0 - 5 WBC/hpf    Comment:        Reflex urine  culture not performed if WBC <=10, OR if Squamous epithelial cells >5. If Squamous epithelial cells >5 suggest recollection.    Bacteria, UA RARE (A) NONE SEEN   Squamous Epithelial / HPF 0-5 0 - 5 /HPF   WBC Clumps PRESENT    Mucus PRESENT     Comment: Performed at [facility], [address], [place], [place] [address]  Procalcitonin     Status: None   Collection Time: 01/26/23  4:10 AM  Result Value Ref Range   Procalcitonin 1.18 ng/mL    Comment:        Interpretation: PCT > 0.5 ng/mL and <= 2 ng/mL: Systemic infection (sepsis) is possible, but other conditions are known to elevate PCT as well. (NOTE)       Sepsis PCT Algorithm           Lower Respiratory Tract                                      Infection PCT Algorithm    ----------------------------     ----------------------------         PCT < 0.25 ng/mL                PCT < 0.10 ng/mL          Strongly encourage             Strongly discourage   discontinuation of antibiotics    initiation of antibiotics    ----------------------------     -----------------------------       PCT 0.25 - 0.50 ng/mL            PCT 0.10 - 0.25 ng/mL               OR       >80% decrease in PCT            Discourage initiation of                                            antibiotics      Encourage discontinuation           of antibiotics    ----------------------------     -----------------------------         PCT >= 0.50 ng/mL              PCT 0.26 - 0.50 ng/mL                AND       <80% decrease in PCT             Encourage initiation of                                             antibiotics       Encourage continuation           of antibiotics    ----------------------------     -----------------------------        PCT >= 0.50 ng/mL  PCT > 0.50 ng/mL               AND         increase in PCT                  Strongly encourage                                      initiation of antibiotics    Strongly  encourage escalation           of antibiotics                                     -----------------------------                                           PCT <= 0.25 ng/mL                                                 OR                                        > 80% decrease in PCT                                      Discontinue / Do not initiate                                             antibiotics  Performed at [facility], [address] [place], [place] [address]   Comprehensive metabolic panel     Status: Abnormal   Collection Time: 01/26/23  4:10 AM  Result Value Ref Range   Sodium 136 135 - 145 mmol/L   Potassium 3.7 3.5 - 5.1 mmol/L   Chloride 104 98 - 111 mmol/L   CO2 23 22 - 32 mmol/L   Glucose, Bld 163 (H) 70 - 99 mg/dL    Comment: Glucose reference range applies only to samples taken after fasting for at least 8 hours.   BUN 16 8 - 23 mg/dL   Creatinine, Ser 5.62 0.61 - 1.24 mg/dL   Calcium 8.4 (L) 8.9 - 10.3 mg/dL   Total Protein 6.3 (L) 6.5 - 8.1 g/dL   Albumin 2.5 (L) 3.5 - 5.0 g/dL   AST 23 15 - 41 U/L   ALT 17 0 - 44 U/L   Alkaline Phosphatase 75 38 - 126 U/L   Total Bilirubin 0.8 0.3 - 1.2 mg/dL   GFR, Estimated >13 >08 mL/min    Comment: (NOTE) Calculated using the CKD-EPI Creatinine Equation (2021)    Anion gap 9 5 - 15    Comment: Performed at [facility], [address] [place], [place] [address]  CBC     Status: Abnormal  Collection Time: 01/26/23  4:10 AM  Result Value Ref Range   WBC 15.7 (H) 4.0 - 10.5 K/uL   RBC 3.54 (L) 4.22 - 5.81 MIL/uL   Hemoglobin 10.9 (L) 13.0 - 17.0 g/dL   HCT 30.8 (L) 65.7 - 84.6 %   MCV 92.9 80.0 - 100.0 fL   MCH 30.8 26.0 - 34.0 pg   MCHC 33.1 30.0 - 36.0 g/dL   RDW 96.2 95.2 - 84.1 %   Platelets 248 150 - 400 K/uL   nRBC 0.0 0.0 - 0.2 %    Comment: Performed at [facility], [address], [place], [place] [address]  Magnesium     Status: None   Collection Time: 01/26/23  4:10 AM  Result Value Ref Range    Magnesium 1.8 1.7 - 2.4 mg/dL    Comment: Performed at [facility], [address] [place], [place] [address]  Phosphorus     Status: Abnormal   Collection Time: 01/26/23  4:10 AM  Result Value Ref Range   Phosphorus 2.4 (L) 2.5 - 4.6 mg/dL    Comment: Performed at [facility], [address], [place], [place] [address]  CBG monitoring, ED     Status: Abnormal   Collection Time: 01/26/23  5:57 AM  Result Value Ref Range   Glucose-Capillary 149 (H) 70 - 99 mg/dL    Comment: Glucose reference range applies only to samples taken after fasting for at least 8 hours.   DG Chest Port 1 View  Result Date: 01/25/2023 CLINICAL DATA:  Sepsis and fevers EXAM: PORTABLE CHEST 1 VIEW COMPARISON:  07/02/2017 FINDINGS: Cardiac shadow is prominent although extension weighted by the portable technique. Lungs are well aerated without focal infiltrate or effusion. Impacted proximal left humeral fracture is noted stable in appearance from a prior exam from 12/20/2022. No acute bony abnormality is noted. IMPRESSION: Chronic impacted left humeral fracture. No other focal abnormality is noted. Electronically Signed   By: Alcide Clever M.D.   On: 01/25/2023 19:49    Pending Labs Unresulted Labs (From admission, onward)     Start     Ordered   02/02/23 0500  Creatinine, serum  (enoxaparin (LOVENOX)    CrCl >/= 30 ml/min)  Weekly,   R     Comments: while on enoxaparin therapy    01/26/23 0103   01/26/23 0500  Hemoglobin A1c  Tomorrow morning,   R        01/26/23 0108   01/25/23 2150  Urine Culture  Once,   R        01/25/23 2150            Vitals/Pain Today's Vitals   01/26/23 0815 01/26/23 0830 01/26/23 0845 01/26/23 0900  BP: 125/66 126/72 132/77 (!) 115/50  Pulse: 97 (!) 102 (!) 111 97  Resp: (!) 8 (!) 24 12 18   Temp:      TempSrc:      SpO2: 96% 97% 96% 96%  Weight:      Height:      PainSc:        Isolation Precautions No active isolations  Medications Medications  enoxaparin  (LOVENOX) injection 40 mg (has no administration in time range)  acetaminophen (TYLENOL) tablet 650 mg (650 mg Oral Given 01/26/23 0818)    Or  acetaminophen (TYLENOL) suppository 650 mg ( Rectal See Alternative 01/26/23 0818)  ondansetron (ZOFRAN) tablet 4 mg (has no administration in time range)    Or  ondansetron (ZOFRAN) injection 4  mg (has no administration in time range)  feeding supplement (GLUCERNA SHAKE) (GLUCERNA SHAKE) liquid 237 mL (has no administration in time range)  levofloxacin (LEVAQUIN) IVPB 750 mg (has no administration in time range)  acetaminophen (TYLENOL) tablet 1,000 mg (1,000 mg Oral Given 01/25/23 1851)  levofloxacin (LEVAQUIN) IVPB 750 mg (0 mg Intravenous Stopped 01/26/23 0029)  potassium chloride 10 mEq in 100 mL IVPB (0 mEq Intravenous Stopped 01/26/23 0709)    Mobility non-ambulatory     Focused Assessments    R Recommendations: See Admitting Provider Note  Report given to:   Additional Notes: crush meds in applesauce. Supervise with meals

## 2023-01-26 NOTE — ED Notes (Signed)
Attempted to call wife to update on pt admitting room.

## 2023-01-26 NOTE — ED Notes (Signed)
Called receiving unit and spoke to [facility], the receiving nurse to alert that patient has been pending for 1 hour and 10 minutes. SBAR has been charted for receiving nurse as well. PT is currently in CT but will be transported when he returns to the department.

## 2023-01-26 NOTE — TOC Initial Note (Signed)
Transition of Care [facility]) - Initial/Assessment Note    Patient Details  Name: Brad Shaw MRN: [id number] Date of Birth: 07-27-1945  Transition of Care [facility]) CM/SW Contact:    Villa Herb, LCSWA Phone Number: 01/26/2023, 3:54 PM  Clinical Narrative:                 CSW updated that pts wife is requesting SNF for pt at D/C. CSW spoke with pts wife who states that she has had [facility] prior to hospital admission. Pts wife states she prefers SNF placement at [facility]. CSW explained that pt needs to be seen by PT prior to SNF referral being sent out. TOC to follow.   Expected Discharge Plan: Skilled Nursing Facility Barriers to Discharge: Continued Medical Work up   Patient Goals and CMS Choice Patient states their goals for this hospitalization and ongoing recovery are:: go to SNF CMS Medicare.gov Compare Post Acute Care list provided to:: Patient Choice offered to / list presented to : Patient [place] ownership interest in [facility].provided to:: Patient    Expected Discharge Plan and Services In-house Referral: Clinical Social Work Discharge Planning Services: CM Consult Post Acute Care Choice: Skilled Nursing Facility Living arrangements for the past 2 months: Single Family Home                                      Prior Living Arrangements/Services Living arrangements for the past 2 months: Single Family Home Lives with:: Spouse Patient language and need for interpreter reviewed:: Yes Do you feel safe going back to the place where you live?: Yes      Need for Family Participation in Patient Care: Yes (Comment) Care giver support system in place?: Yes (comment)   Criminal Activity/Legal Involvement Pertinent to Current Situation/Hospitalization: No - Comment as needed  Activities of Daily Living      Permission Sought/Granted                  Emotional Assessment Appearance:: Appears stated age Attitude/Demeanor/Rapport:  Engaged Affect (typically observed): Accepting Orientation: : Oriented to Self Alcohol / Substance Use: Not Applicable Psych Involvement: No (comment)  Admission diagnosis:  Acute cystitis without hematuria [N30.00] Sepsis due to urinary tract infection (HCC) [A41.9, N39.0] Sepsis without acute organ dysfunction, due to unspecified organism [facility]) [A41.9] Patient Active Problem List   Diagnosis Date Noted   Hypokalemia 01/26/2023   Hyperglycemia 01/26/2023   Essential hypertension 01/26/2023   Mixed hyperlipidemia 01/26/2023   Hypoalbuminemia due to protein-calorie malnutrition (HCC) 01/26/2023   Sepsis due to undetermined organism (HCC) 01/26/2023   UTI (urinary tract infection) 01/26/2023   Major neurocognitive disorder (HCC) 01/26/2023   Sepsis due to urinary tract infection (HCC) 01/25/2023   Malignant neoplasm of prostate (HCC) 03/19/2021   Alzheimer disease (HCC) 01/17/2021   Low back pain, unspecified 05/01/2020   Memory disturbance 02/14/2019   Acute low back pain 07/08/2017   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   PCP:  Joycelyn Rua, MD Pharmacy:   [facility] [address], [place] - [address] [place] [address] [place] [address] Phone: [phone] Fax: [phone]     Social Determinants of Health (SDOH) Social History: SDOH Screenings   Food Insecurity: No Food Insecurity (10/29/2022)   Received from [facility]  Transportation Needs: No Transportation Needs (10/29/2022)   [facility]  Utilities: Not At Risk (10/29/2022)   Received from [facility], [facility]  Financial Resource Strain: Low Risk  (10/29/2022)   Received from [facility], [facility]  Physical Activity: Insufficiently Active (10/29/2022)   Received from [facility], [facility]  Social Connections: Moderately Integrated (10/29/2022)   Received from [facility], [facility]  Stress: No Stress  Concern Present (10/29/2022)   Received from [facility], [facility]  Tobacco Use: Low Risk  (01/25/2023)   SDOH Interventions:     Readmission Risk Interventions     No data to display

## 2023-01-26 NOTE — Progress Notes (Addendum)
PROGRESS NOTE  Brad Shaw ZOX:[id number] DOB: 12-Mar-1946 DOA: 01/25/2023 PCP: Joycelyn Rua, MD  Brief History:  77 year old male with a history of hypertension, hyperlipidemia, Alzheimer's dementia, prostate cancer, and urinary incontinence presenting with generalized weakness, malaise, and vomiting.  The patient had 4 episodes of nausea and vomiting on 01/22/2023.  This seems to have resolved without any further episodes.  There was no hematemesis.  On 01/23/2023, the patient and spouse noted the patient had decreased oral intake and generalized weakness.  This had progressed over the next 2 days.  She noted that he was a little bit more sleepy than usual and had difficulty getting in and out of bed secondary to his generalized weakness.  She noted that the patient began developing a fever up to 101.0 F on 01/24/2023.  This continued throughout the day.  His fever was up to 103.0 F on the morning of 01/25/2023.  As result, the patient was brought to emergency department for further evaluation treatment.  There has been no complaints of chest pain, shortness breath, coughing, hematemesis, diarrhea, abdominal pain.  There is no new medications. In the ED, the patient was febrile up to 103.0 F.  He was tachycardic up to 111, but remained hemodynamically stable.  Oxygen saturation was 87% on room air.  The patient was placed on supplemental oxygen at 2 L with saturation up to 97%.  WBC 16.9, hemoglobin 11.5, platelets 274,000.  Sodium 136, potassium 3.7, bicarbonate 23, serum creatinine 0.87.  LFTs were unremarkable.  Chest x-ray was negative for infiltrates.  UA>50 WBC.  Lactic acid peaked at 1.5.  COVID-19 PCR was negative.  Blood and urine cultures were sent.  The patient was started on levofloxacin.   Assessment/Plan: Sepsis -Patient presented with fever, leukocytosis, tachycardia -Secondary urinary source -Procalcitonin 1.18 -Lactic acid patient 1.5>> 0.9 -Continue IV fluids -Broaden  antibiotic coverage pending culture data -Follow blood culture  Pyuria -Suspicious for UTI -Follow urine culture  Hypoxia -Patient has new oxygen requirement -Personally reviewed chest x-ray--no consolidations; -Obtain CT chest  Essential hypertension -Holding amlodipine and lisinopril temporarily -BPs remain well-controlled -Monitor vital signs  Major neurocognitive disorder -Continue Aricept and Namenda  Mixed hyperlipidemia -Continue atorvastatin  Hypokalemia -Replete -Magnesium 1.8  Hypophosphatemia -Replete  Left humerus fracture history -due to fall on 12/20/22 -follows [facility] -full ROM of hand and wrist only           Family Communication:   spouse updated 8/26  Consultants:  none  Code Status:  DNR  DVT Prophylaxis:  [place] Lovenox   Procedures: As Listed in Progress Note Above  Antibiotics: [place] 8/26>>     Subjective: Patient denies fevers, chills, headache, chest pain, dyspnea, nausea, vomiting, diarrhea, abdominal pain   Objective: Vitals:   01/26/23 0830 01/26/23 0845 01/26/23 0900 01/26/23 0913  BP: 126/72 132/77 (!) 115/50   Pulse: (!) 102 (!) 111 97   Resp: (!) 24 12 18    Temp:    99.5 F (37.5 C)  TempSrc:    Oral  SpO2: 97% 96% 96%   Weight:      Height:        Intake/Output Summary (Last 24 hours) at 01/26/2023 0918 Last data filed at 01/26/2023 0709 Gross per 24 hour  Intake 1281.76 ml  Output 100 ml  Net 1181.76 ml   Weight change:  Exam:  General:  Pt is alert, follows commands appropriately, not in acute distress HEENT: No icterus,  No thrush, No neck mass, [place]/AT Cardiovascular: RRR, S1/S2, no rubs, no gallops Respiratory: bibasilar crackles.  No wheeze Abdomen: Soft/+BS, non tender, non distended, no guarding Extremities: No edema, No lymphangitis, No petechiae, No rashes, no synovitis   Data Reviewed: I have personally reviewed following labs and imaging studies Basic Metabolic  Panel: Recent Labs  Lab 01/25/23 1839 01/26/23 0410  NA 134* 136  K 3.4* 3.7  CL 99 104  CO2 24 23  GLUCOSE 194* 163*  BUN 16 16  CREATININE 0.87 0.81  CALCIUM 8.4* 8.4*  MG  --  1.8  PHOS  --  2.4*   Liver Function Tests: Recent Labs  Lab 01/25/23 1839 01/26/23 0410  AST 24 23  ALT 19 17  ALKPHOS 84 75  BILITOT 1.0 0.8  PROT 6.7 6.3*  ALBUMIN 2.8* 2.5*   No results for input(s): "LIPASE", "AMYLASE" in the last 168 hours. No results for input(s): "AMMONIA" in the last 168 hours. Coagulation Profile: Recent Labs  Lab 01/25/23 1839  INR 1.1   CBC: Recent Labs  Lab 01/25/23 1839 01/26/23 0410  WBC 16.9* 15.7*  NEUTROABS 15.1*  --   HGB 11.5* 10.9*  HCT 34.4* 32.9*  MCV 92.0 92.9  PLT 274 248   Cardiac Enzymes: No results for input(s): "CKTOTAL", "CKMB", "CKMBINDEX", "TROPONINI" in the last 168 hours. BNP: Invalid input(s): "POCBNP" CBG: Recent Labs  Lab 01/26/23 0557  GLUCAP 149*   HbA1C: No results for input(s): "HGBA1C" in the last 72 hours. Urine analysis:    Component Value Date/Time   COLORURINE AMBER (A) 01/25/2023 2150   APPEARANCEUR CLOUDY (A) 01/25/2023 2150   LABSPEC 1.024 01/25/2023 2150   PHURINE 5.0 01/25/2023 2150   GLUCOSEU NEGATIVE 01/25/2023 2150   HGBUR MODERATE (A) 01/25/2023 2150   BILIRUBINUR NEGATIVE 01/25/2023 2150   KETONESUR NEGATIVE 01/25/2023 2150   PROTEINUR 100 (A) 01/25/2023 2150   NITRITE NEGATIVE 01/25/2023 2150   LEUKOCYTESUR MODERATE (A) 01/25/2023 2150   Sepsis Labs: @LABRCNTIP (procalcitonin:4,lacticidven:4) ) Recent Results (from the past 240 hour(s))  Culture, blood (Routine x 2)     Status: None (Preliminary result)   Collection Time: 01/25/23  6:50 PM   Specimen: BLOOD RIGHT HAND  Result Value Ref Range Status   Specimen Description BLOOD RIGHT HAND BLOOD  Final   Special Requests   Final    BOTTLES DRAWN AEROBIC AND ANAEROBIC Blood Culture results may not be optimal due to an excessive volume of  blood received in culture bottles   Culture   Final    NO GROWTH < 12 HOURS Performed at [facility], [address] [place], [place] [address]    Report Status PENDING  Incomplete  Culture, blood (Routine x 2)     Status: None (Preliminary result)   Collection Time: 01/25/23  7:05 PM   Specimen: BLOOD LEFT HAND  Result Value Ref Range Status   Specimen Description BLOOD LEFT HAND BLOOD  Final   Special Requests   Final    BOTTLES DRAWN AEROBIC ONLY Blood Culture adequate volume   Culture   Final    NO GROWTH < 12 HOURS Performed at [facility], [address] [place], [place] [address]    Report Status PENDING  Incomplete  Resp panel by RT-PCR (RSV, Flu A&B, Covid) Anterior Nasal Swab     Status: None   Collection Time: 01/25/23  8:31 PM   Specimen: Anterior Nasal Swab  Result Value Ref Range Status   SARS Coronavirus 2  by RT PCR NEGATIVE NEGATIVE Final    Comment: (NOTE) SARS-CoV-2 target nucleic acids are NOT DETECTED.  The SARS-CoV-2 RNA is generally detectable in upper respiratory specimens during the acute phase of infection. The lowest concentration of SARS-CoV-2 viral copies this assay can detect is 138 copies/mL. A negative result does not preclude SARS-Cov-2 infection and should not be used as the sole basis for treatment or other patient management decisions. A negative result may occur with  improper specimen collection/handling, submission of specimen other than nasopharyngeal swab, presence of viral mutation(s) within the areas targeted by this assay, and inadequate number of viral copies(<138 copies/mL). A negative result must be combined with clinical observations, patient history, and epidemiological information. The expected result is Negative.  Fact Sheet for Patients:  BloggerCourse.com  Fact Sheet for Healthcare Providers:  SeriousBroker.it  This test is no t yet approved or cleared by the [place] FDA and  has been authorized for detection and/or diagnosis of SARS-CoV-2 by FDA under an Emergency Use Authorization (EUA). This EUA will remain  in effect (meaning this test can be used) for the duration of the COVID-19 declaration under Section 564(b)(1) of the Act, 21 U.S.C.section 360bbb-3(b)(1), unless the authorization is terminated  or revoked sooner.       Influenza A by PCR NEGATIVE NEGATIVE Final   Influenza B by PCR NEGATIVE NEGATIVE Final    Comment: (NOTE) The Xpert Xpress SARS-CoV-2/FLU/RSV plus assay is intended as an aid in the diagnosis of influenza from Nasopharyngeal swab specimens and should not be used as a sole basis for treatment. Nasal washings and aspirates are unacceptable for Xpert Xpress SARS-CoV-2/FLU/RSV testing.  Fact Sheet for Patients: BloggerCourse.com  Fact Sheet for Healthcare Providers: SeriousBroker.it  This test is not yet approved or cleared by the [place] FDA and has been authorized for detection and/or diagnosis of SARS-CoV-2 by FDA under an Emergency Use Authorization (EUA). This EUA will remain in effect (meaning this test can be used) for the duration of the COVID-19 declaration under Section 564(b)(1) of the Act, 21 U.S.C. section 360bbb-3(b)(1), unless the authorization is terminated or revoked.     Resp Syncytial Virus by PCR NEGATIVE NEGATIVE Final    Comment: (NOTE) Fact Sheet for Patients: BloggerCourse.com  Fact Sheet for Healthcare Providers: SeriousBroker.it  This test is not yet approved or cleared by the [place] FDA and has been authorized for detection and/or diagnosis of SARS-CoV-2 by FDA under an Emergency Use Authorization (EUA). This EUA will remain in effect (meaning this test can be used) for the duration of the COVID-19 declaration under Section 564(b)(1) of the Act, 21 U.S.C. section  360bbb-3(b)(1), unless the authorization is terminated or revoked.  Performed at [facility], [address], [place], [place] [address]      Scheduled Meds:  enoxaparin (LOVENOX) injection  40 mg Subcutaneous Daily   feeding supplement (GLUCERNA SHAKE)  237 mL Oral TID BM   Continuous Infusions:  levofloxacin (LEVAQUIN) IV      Procedures/Studies: DG Chest Port 1 View  Result Date: 01/25/2023 CLINICAL DATA:  Sepsis and fevers EXAM: PORTABLE CHEST 1 VIEW COMPARISON:  07/02/2017 FINDINGS: Cardiac shadow is prominent although extension weighted by the portable technique. Lungs are well aerated without focal infiltrate or effusion. Impacted proximal left humeral fracture is noted stable in appearance from a prior exam from 12/20/2022. No acute bony abnormality is noted. IMPRESSION: Chronic impacted left humeral fracture. No other focal abnormality is noted. Electronically Signed   By:  Alcide Clever M.D.   On: 01/25/2023 19:49    Catarina Hartshorn, DO  [facility]  If 7PM-7AM, please contact night-coverage [email or web address] Password [email or web address] 01/26/2023, 9:18 AM   LOS: 1 day

## 2023-01-26 NOTE — Progress Notes (Signed)
   01/26/23 1527  Assess: MEWS Score  Temp (!) 103.2 F (39.6 C)  BP (!) 140/75  MAP (mmHg) 93  Pulse Rate (!) 107  Resp 18  SpO2 95 %  O2 Device Nasal Cannula  O2 Flow Rate (L/min) 3 L/min  Assess: MEWS Score  MEWS Temp 2  MEWS Systolic 0  MEWS Pulse 1  MEWS RR 0  MEWS LOC 0  MEWS Score 3  MEWS Score Color Yellow  Assess: if the MEWS score is Yellow or Red  Were vital signs accurate and taken at a resting state? Yes  Does the patient meet 2 or more of the SIRS criteria? Yes  Does the patient have a confirmed or suspected source of infection? Yes  MEWS guidelines implemented  Yes, yellow  Treat  MEWS Interventions Considered administering scheduled or prn medications/treatments as ordered  Take Vital Signs  Increase Vital Sign Frequency  Yellow: Q2hr x1, continue Q4hrs until patient remains green for 12hrs  Escalate  MEWS: Escalate Yellow: Discuss with charge nurse and consider notifying provider and/or RRT  Notify: Charge Nurse/RN  Name of Charge Nurse/RN Notified Emily, RN  Assess: SIRS CRITERIA  SIRS Temperature  1  SIRS Pulse 1  SIRS Respirations  0  SIRS WBC 0  SIRS Score Sum  2   PRN tylenol given. Will reassess.

## 2023-01-26 NOTE — Progress Notes (Signed)
Pharmacy Antibiotic Note  Brad Shaw is a 77 y.o. male admitted on 01/25/2023 with UTI.  Pharmacy has been consulted for Levofloxacin dosing.  Plan: Levofloxacin 750 mg IV q24h x 4 more days  Height: 6' (182.9 cm) Weight: 88.5 kg (195 lb) IBW/kg (Calculated) : 77.6  Temp (24hrs), Avg:101.1 F (38.4 C), Min:99.2 F (37.3 C), Max:103 F (39.4 C)  Recent Labs  Lab 01/25/23 1839 01/25/23 2027  WBC 16.9*  --   CREATININE 0.87  --   LATICACIDVEN 1.5 0.9    Estimated Creatinine Clearance: 78 mL/min (by C-G formula based on SCr of 0.87 mg/dL).    Allergies  Allergen Reactions   Cefdinir Rash    Brad Shaw 01/26/2023 1:07 AM

## 2023-01-27 ENCOUNTER — Encounter (HOSPITAL_COMMUNITY): Payer: Self-pay | Admitting: Radiology

## 2023-01-27 ENCOUNTER — Inpatient Hospital Stay (HOSPITAL_COMMUNITY): Payer: Medicare Other

## 2023-01-27 DIAGNOSIS — A419 Sepsis, unspecified organism: Secondary | ICD-10-CM | POA: Diagnosis not present

## 2023-01-27 DIAGNOSIS — F0390 Unspecified dementia without behavioral disturbance: Secondary | ICD-10-CM | POA: Diagnosis not present

## 2023-01-27 DIAGNOSIS — N390 Urinary tract infection, site not specified: Secondary | ICD-10-CM | POA: Diagnosis not present

## 2023-01-27 LAB — CBC
HCT: 32 % — ABNORMAL LOW (ref 39.0–52.0)
Hemoglobin: 10.7 g/dL — ABNORMAL LOW (ref 13.0–17.0)
MCH: 30.7 pg (ref 26.0–34.0)
MCHC: 33.4 g/dL (ref 30.0–36.0)
MCV: 91.7 fL (ref 80.0–100.0)
Platelets: 251 10*3/uL (ref 150–400)
RBC: 3.49 MIL/uL — ABNORMAL LOW (ref 4.22–5.81)
RDW: 15.5 % (ref 11.5–15.5)
WBC: 11.1 10*3/uL — ABNORMAL HIGH (ref 4.0–10.5)
nRBC: 0 % (ref 0.0–0.2)

## 2023-01-27 LAB — GLUCOSE, CAPILLARY
Glucose-Capillary: 151 mg/dL — ABNORMAL HIGH (ref 70–99)
Glucose-Capillary: 196 mg/dL — ABNORMAL HIGH (ref 70–99)
Glucose-Capillary: 130 mg/dL — ABNORMAL HIGH (ref 70–99)
Glucose-Capillary: 155 mg/dL — ABNORMAL HIGH (ref 70–99)

## 2023-01-27 LAB — MAGNESIUM: Magnesium: 1.9 mg/dL (ref 1.7–2.4)

## 2023-01-27 LAB — BASIC METABOLIC PANEL
Anion gap: 9 (ref 5–15)
BUN: 15 mg/dL (ref 8–23)
CO2: 25 mmol/L (ref 22–32)
Calcium: 8.9 mg/dL (ref 8.9–10.3)
Chloride: 103 mmol/L (ref 98–111)
Creatinine, Ser: 0.69 mg/dL (ref 0.61–1.24)
GFR, Estimated: >60 mL/min (ref >60)
Glucose, Bld: 132 mg/dL — ABNORMAL HIGH (ref 70–99)
Potassium: 3 mmol/L — ABNORMAL LOW (ref 3.5–5.1)
Sodium: 137 mmol/L (ref 135–145)

## 2023-01-27 LAB — PHOSPHORUS: Phosphorus: 2.7 mg/dL (ref 2.5–4.6)

## 2023-01-27 LAB — D-DIMER, QUANTITATIVE: D-Dimer, Quant: 4.15 ug{FEU}/mL — ABNORMAL HIGH (ref 0.00–0.50)

## 2023-01-27 MED ORDER — POTASSIUM CHLORIDE CRYS ER 20 MEQ PO TBCR
40.0000 meq | EXTENDED_RELEASE_TABLET | Freq: Once | ORAL | Status: AC
  Administered 2023-01-27: 40 meq via ORAL
  Filled 2023-01-27: qty 2

## 2023-01-27 MED ORDER — IOHEXOL 350 MG/ML SOLN
75.0000 mL | Freq: Once | INTRAVENOUS | Status: AC | PRN
  Administered 2023-01-27: 75 mL via INTRAVENOUS

## 2023-01-27 NOTE — Progress Notes (Signed)
PROGRESS NOTE  Brad Shaw:[id number] DOB: 06/11/1945 DOA: 01/25/2023 PCP: Joycelyn Rua, MD  Brief History:  77 year old male with a history of hypertension, hyperlipidemia, Alzheimer's dementia, prostate cancer, and urinary incontinence presenting with generalized weakness, malaise, and vomiting.  The patient had 4 episodes of nausea and vomiting on 01/22/2023.  This seems to have resolved without any further episodes.  There was no hematemesis.  On 01/23/2023, the patient and spouse noted the patient had decreased oral intake and generalized weakness.  This had progressed over the next 2 days.  She noted that he was a little bit more sleepy than usual and had difficulty getting in and out of bed secondary to his generalized weakness.  She noted that the patient began developing a fever up to 101.0 F on 01/24/2023.  This continued throughout the day.  His fever was up to 103.0 F on the morning of 01/25/2023.  As result, the patient was brought to emergency department for further evaluation treatment.  There has been no complaints of chest pain, shortness breath, coughing, hematemesis, diarrhea, abdominal pain.  There is no new medications. In the ED, the patient was febrile up to 103.0 F.  He was tachycardic up to 111, but remained hemodynamically stable.  Oxygen saturation was 87% on room air.  The patient was placed on supplemental oxygen at 2 L with saturation up to 97%.  WBC 16.9, hemoglobin 11.5, platelets 274,000.  Sodium 136, potassium 3.7, bicarbonate 23, serum creatinine 0.87.  LFTs were unremarkable.  Chest x-ray was negative for infiltrates.  UA>50 WBC.  Lactic acid peaked at 1.5.  COVID-19 PCR was negative.  Blood and urine cultures were sent.  The patient was started on levofloxacin in ED   Assessment/Plan: Sepsis -Patient presented with fever, leukocytosis, tachycardia -Secondary urinary source -Procalcitonin 1.18 -Lactic acid patient 1.5>> 0.9 -initially received IV  fluids -Broaden antibiotic coverage pending culture data -Follow blood culture--neg to date   UTI--E faecalis -UA>50WBC -Follow urine culture -plan to de-escalate 8/28 once final cultures are back   Hypoxia -Patient has new oxygen requirement -Personally reviewed chest x-ray--no consolidations; -Obtain CT chest--Subsegmental atelectasis and volume loss within the right middle lobe and right lower lobe -with elevated D-dimer>>order CTA cjest   Essential hypertension -Holding amlodipine and lisinopril temporarily -BPs remain well-controlled -Monitor vital signs   Major neurocognitive disorder -Continue Aricept and Namenda   Mixed hyperlipidemia -Continue atorvastatin   Hypokalemia -Replete -Magnesium 1.9   Hypophosphatemia -Replete   Left humerus fracture history -due to fall on 12/20/22 -follows [facility] -full ROM of hand and wrist only                     Family Communication:   spouse updated 8/28   Consultants:  none   Code Status:  DNR   DVT Prophylaxis:  [place] Lovenox     Procedures: As Listed in Progress Note Above   Antibiotics: Merrem 8/26>>         Subjective: Patient denies fevers, chills, headache, chest pain, dyspnea, nausea, vomiting, diarrhea, abdominal pain  Objective: Vitals:   01/26/23 1720 01/26/23 1927 01/27/23 0526 01/27/23 1244  BP:  114/64 131/71 127/80  Pulse: 92 91 95 94  Resp:  20 18 16   Temp: 99.9 F (37.7 C) 99 F (37.2 C) 98.6 F (37 C) 99.6 F (37.6 C)  TempSrc: Axillary   Oral  SpO2: 96% 96% 92% 94%  Weight:  Height:        Intake/Output Summary (Last 24 hours) at 01/27/2023 1651 Last data filed at 01/27/2023 0900 Gross per 24 hour  Intake 320 ml  Output 600 ml  Net -280 ml   Weight change:  Exam:  General:  Pt is alert, follows commands appropriately, not in acute distress HEENT: No icterus, No thrush, No neck mass, [place]/AT Cardiovascular: RRR, S1/S2, no rubs, no  gallops Respiratory: bibasilar crackles.  No wheeze Abdomen: Soft/+BS, non tender, non distended, no guarding Extremities: No edema, No lymphangitis, No petechiae, No rashes, no synovitis   Data Reviewed: I have personally reviewed following labs and imaging studies Basic Metabolic Panel: Recent Labs  Lab 01/25/23 1839 01/26/23 0410 01/27/23 0414  NA 134* 136 137  K 3.4* 3.7 3.0*  CL 99 104 103  CO2 24 23 25   GLUCOSE 194* 163* 132*  BUN 16 16 15   CREATININE 0.87 0.81 0.69  CALCIUM 8.4* 8.4* 8.9  MG  --  1.8 1.9  PHOS  --  2.4* 2.7   Liver Function Tests: Recent Labs  Lab 01/25/23 1839 01/26/23 0410  AST 24 23  ALT 19 17  ALKPHOS 84 75  BILITOT 1.0 0.8  PROT 6.7 6.3*  ALBUMIN 2.8* 2.5*   No results for input(s): "LIPASE", "AMYLASE" in the last 168 hours. No results for input(s): "AMMONIA" in the last 168 hours. Coagulation Profile: Recent Labs  Lab 01/25/23 1839  INR 1.1   CBC: Recent Labs  Lab 01/25/23 1839 01/26/23 0410 01/27/23 0414  WBC 16.9* 15.7* 11.1*  NEUTROABS 15.1*  --   --   HGB 11.5* 10.9* 10.7*  HCT 34.4* 32.9* 32.0*  MCV 92.0 92.9 91.7  PLT 274 248 251   Cardiac Enzymes: No results for input(s): "CKTOTAL", "CKMB", "CKMBINDEX", "TROPONINI" in the last 168 hours. BNP: Invalid input(s): "POCBNP" CBG: Recent Labs  Lab 01/26/23 1150 01/26/23 1726 01/26/23 2340 01/27/23 0528 01/27/23 1112  GLUCAP 156* 152* 146* 151* 196*   HbA1C: Recent Labs    01/26/23 0410  HGBA1C 6.4*   Urine analysis:    Component Value Date/Time   COLORURINE AMBER (A) 01/25/2023 2150   APPEARANCEUR CLOUDY (A) 01/25/2023 2150   LABSPEC 1.024 01/25/2023 2150   PHURINE 5.0 01/25/2023 2150   GLUCOSEU NEGATIVE 01/25/2023 2150   HGBUR MODERATE (A) 01/25/2023 2150   BILIRUBINUR NEGATIVE 01/25/2023 2150   KETONESUR NEGATIVE 01/25/2023 2150   PROTEINUR 100 (A) 01/25/2023 2150   NITRITE NEGATIVE 01/25/2023 2150   LEUKOCYTESUR MODERATE (A) 01/25/2023 2150    Sepsis Labs: @LABRCNTIP (procalcitonin:4,lacticidven:4) ) Recent Results (from the past 240 hour(s))  Culture, blood (Routine x 2)     Status: None (Preliminary result)   Collection Time: 01/25/23  6:50 PM   Specimen: BLOOD RIGHT HAND  Result Value Ref Range Status   Specimen Description BLOOD RIGHT HAND BLOOD  Final   Special Requests   Final    BOTTLES DRAWN AEROBIC AND ANAEROBIC Blood Culture results may not be optimal due to an excessive volume of blood received in culture bottles   Culture   Final    NO GROWTH 2 DAYS Performed at [facility], [address] [place], [place] [address]    Report Status PENDING  Incomplete  Culture, blood (Routine x 2)     Status: None (Preliminary result)   Collection Time: 01/25/23  7:05 PM   Specimen: BLOOD LEFT HAND  Result Value Ref Range Status   Specimen Description BLOOD LEFT HAND BLOOD  Final   Special Requests   Final    BOTTLES DRAWN AEROBIC ONLY Blood Culture adequate volume   Culture   Final    NO GROWTH 2 DAYS Performed at [facility], [address] [place], [place] [address]    Report Status PENDING  Incomplete  Resp panel by RT-PCR (RSV, Flu A&B, Covid) Anterior Nasal Swab     Status: None   Collection Time: 01/25/23  8:31 PM   Specimen: Anterior Nasal Swab  Result Value Ref Range Status   SARS Coronavirus 2 by RT PCR NEGATIVE NEGATIVE Final    Comment: (NOTE) SARS-CoV-2 target nucleic acids are NOT DETECTED.  The SARS-CoV-2 RNA is generally detectable in upper respiratory specimens during the acute phase of infection. The lowest concentration of SARS-CoV-2 viral copies this assay can detect is 138 copies/mL. A negative result does not preclude SARS-Cov-2 infection and should not be used as the sole basis for treatment or other patient management decisions. A negative result may occur with  improper specimen collection/handling, submission of specimen other than nasopharyngeal swab, presence of viral mutation(s)  within the areas targeted by this assay, and inadequate number of viral copies(<138 copies/mL). A negative result must be combined with clinical observations, patient history, and epidemiological information. The expected result is Negative.  Fact Sheet for Patients:  BloggerCourse.com  Fact Sheet for Healthcare Providers:  SeriousBroker.it  This test is no t yet approved or cleared by the [place] FDA and  has been authorized for detection and/or diagnosis of SARS-CoV-2 by FDA under an Emergency Use Authorization (EUA). This EUA will remain  in effect (meaning this test can be used) for the duration of the COVID-19 declaration under Section 564(b)(1) of the Act, 21 U.S.C.section 360bbb-3(b)(1), unless the authorization is terminated  or revoked sooner.       Influenza A by PCR NEGATIVE NEGATIVE Final   Influenza B by PCR NEGATIVE NEGATIVE Final    Comment: (NOTE) The Xpert Xpress SARS-CoV-2/FLU/RSV plus assay is intended as an aid in the diagnosis of influenza from Nasopharyngeal swab specimens and should not be used as a sole basis for treatment. Nasal washings and aspirates are unacceptable for Xpert Xpress SARS-CoV-2/FLU/RSV testing.  Fact Sheet for Patients: BloggerCourse.com  Fact Sheet for Healthcare Providers: SeriousBroker.it  This test is not yet approved or cleared by the [place] FDA and has been authorized for detection and/or diagnosis of SARS-CoV-2 by FDA under an Emergency Use Authorization (EUA). This EUA will remain in effect (meaning this test can be used) for the duration of the COVID-19 declaration under Section 564(b)(1) of the Act, 21 U.S.C. section 360bbb-3(b)(1), unless the authorization is terminated or revoked.     Resp Syncytial Virus by PCR NEGATIVE NEGATIVE Final    Comment: (NOTE) Fact Sheet for  Patients: BloggerCourse.com  Fact Sheet for Healthcare Providers: SeriousBroker.it  This test is not yet approved or cleared by the [place] FDA and has been authorized for detection and/or diagnosis of SARS-CoV-2 by FDA under an Emergency Use Authorization (EUA). This EUA will remain in effect (meaning this test can be used) for the duration of the COVID-19 declaration under Section 564(b)(1) of the Act, 21 U.S.C. section 360bbb-3(b)(1), unless the authorization is terminated or revoked.  Performed at [facility], [address], [place], [place] [address]   Urine Culture     Status: Abnormal (Preliminary result)   Collection Time: 01/25/23  9:50 PM   Specimen: Urine, Random  Result Value Ref Range Status  Specimen Description   Final    URINE, RANDOM Performed at [facility], [address], [place], [place] [address]    Special Requests   Final    NONE Reflexed from [id number] Performed at [facility], [address], [place], [place] [address]    Culture (A)  Final    >=100,000 COLONIES/mL ENTEROCOCCUS FAECALIS SUSCEPTIBILITIES TO FOLLOW Performed at [facility], [address] [place], [place] [address]    Report Status PENDING  Incomplete     Scheduled Meds:  atorvastatin  40 mg Oral q1800   donepezil  10 mg Oral QHS   enoxaparin (LOVENOX) injection  40 mg Subcutaneous Daily   feeding supplement (GLUCERNA SHAKE)  237 mL Oral TID BM   finasteride  5 mg Oral Daily   memantine  10 mg Oral BID   phosphorus  500 mg Oral BID   sertraline  50 mg Oral Daily   Continuous Infusions:  meropenem (MERREM) IV 1 g (01/27/23 1413)    Procedures/Studies: CT CHEST WO CONTRAST  Result Date: 01/26/2023 CLINICAL DATA:  Respiratory illness. Generalized weakness and fever with hypoxia and dyspnea. EXAM: CT CHEST WITHOUT CONTRAST TECHNIQUE: Multidetector CT imaging of the chest was performed following the standard protocol  without IV contrast. RADIATION DOSE REDUCTION: This exam was performed according to the departmental dose-optimization program which includes automated exposure control, adjustment of the mA and/or kV according to patient size and/or use of iterative reconstruction technique. COMPARISON:  Remote PET-CT from 01/23/2021 FINDINGS: Cardiovascular: Heart size is normal. Aortic atherosclerosis with coronary artery calcifications. No pericardial effusion. Mediastinum/Nodes: No enlarged mediastinal or axillary lymph nodes. Thyroid gland, trachea, and esophagus demonstrate no significant findings. Lungs/Pleura: There is no pleural effusion or airspace consolidation. No interstitial edema. No signs of pneumothorax. No suspicious pulmonary nodule or mass identified. Pleural thickening is noted of overlying the posterior lower lobes. Subsegmental atelectasis and volume loss is identified within the right middle lobe and right lower lobe. There is also mild subsegmental atelectasis versus scarring within the posterior left lower lobe and lingula. Upper Abdomen: No acute abnormality. Gallstone is identified measuring 1.3 cm. Musculoskeletal: Subacute fracture deformity involving the left humeral neck is again noted. No acute fractures or suspicious bone lesions identified. IMPRESSION: 1. Subsegmental atelectasis and volume loss within the right middle lobe and right lower lobe. There is also mild subsegmental atelectasis versus scarring within the posterior left lower lobe and lingula. 2. No signs of pleural effusion or airspace consolidation. 3. Coronary artery calcifications. 4. Cholelithiasis. 5. Subacute fracture deformity involving the left humeral neck. 6. Aortic Atherosclerosis (ICD10-I70.0). Electronically Signed   By: Signa Kell M.D.   On: 01/26/2023 10:42   DG Chest Port 1 View  Result Date: 01/25/2023 CLINICAL DATA:  Sepsis and fevers EXAM: PORTABLE CHEST 1 VIEW COMPARISON:  07/02/2017 FINDINGS: Cardiac shadow  is prominent although extension weighted by the portable technique. Lungs are well aerated without focal infiltrate or effusion. Impacted proximal left humeral fracture is noted stable in appearance from a prior exam from 12/20/2022. No acute bony abnormality is noted. IMPRESSION: Chronic impacted left humeral fracture. No other focal abnormality is noted. Electronically Signed   By: Alcide Clever M.D.   On: 01/25/2023 19:49    Catarina Hartshorn, DO  [facility]  If 7PM-7AM, please contact night-coverage [email or web address] Password [email or web address] 01/27/2023, 4:51 PM   LOS: 2 days

## 2023-01-27 NOTE — Plan of Care (Signed)
  Problem: Acute Rehab PT Goals(only PT should resolve) Goal: Pt Will Go Supine/Side To Sit Outcome: Progressing Flowsheets (Taken 01/27/2023 1334) Pt will go Supine/Side to Sit:  with minimal assist  with moderate assist Goal: Patient Will Transfer Sit To/From Stand Outcome: Progressing Flowsheets (Taken 01/27/2023 1334) Patient will transfer sit to/from stand:  with minimal assist  with moderate assist Goal: Pt Will Transfer Bed To Chair/Chair To Bed Outcome: Progressing Flowsheets (Taken 01/27/2023 1334) Pt will Transfer Bed to Chair/Chair to Bed:  with min assist  with mod assist Goal: Pt Will Ambulate Outcome: Progressing Flowsheets (Taken 01/27/2023 1334) Pt will Ambulate:  25 feet  with moderate assist  with minimal assist Note: Quad-cane   1:35 PM, 01/27/23 Ocie Bob, MPT Physical Therapist with [facility] [phone] office [phone] mobile phone

## 2023-01-27 NOTE — Evaluation (Signed)
Physical Therapy Evaluation Patient Details Name: Brad Shaw MRN: [id number] DOB: 04/15/1946 Today's Date: 01/27/2023  History of Present Illness  Brad Shaw is a 77 y.o. male with medical history significant of hypertension, hyperlipidemia, Alzheimer's disease, urinary incontinence who presents to the emergency department via EMS from home due to 3 days of not feeling well.  Patient was unable to provide history possibly due to dementia, history was provided by EDP and wife at bedside.  Per report, patient had about 4 episodes of nonbloody vomiting about 3 days ago which self resolved.  Patient started to have fever 2 days ago, this was controlled with Tylenol and intermittent Motrin.  He has become weaker and was only able to tolerate minimal amount of juice as meals.  Fever went up to 102.10F today, this was associated with chills, so EMS was activated.  On arrival of EMS team, 1 g of IV Rocephin was given en route to the hospital.  There was no report of chest pain, shortness of breath, coughing, vomiting, diarrhea.  Last bowel movement was 2 days ago.  Patient presented to the ED on 12/20/2022 due to left humeral fracture, he was placed in a sling and swath and was asked to follow-up with orthopedic surgeon as an outpatien   Clinical Impression  Patient demonstrates slow labored movement for sitting up at bedside with limited use of LUE, unsteady on feet and limited to a few short side steps using RUE on RW before having to sit due to fatigue and generalized weakness.  Patient tolerated sitting up in chair after therapy - nursing staff aware.  Patient will benefit from continued skilled physical therapy in hospital and recommended venue below to increase strength, balance, endurance for safe ADLs and gait.           If plan is discharge home, recommend the following: A lot of help with bathing/dressing/bathroom;A lot of help with walking and/or transfers;Help with stairs or ramp for  entrance;Assistance with cooking/housework   Can travel by private vehicle   No    Equipment Recommendations Cane  Recommendations for Other Services       Functional Status Assessment Patient has had a recent decline in their functional status and demonstrates the ability to make significant improvements in function in a reasonable and predictable amount of time.     Precautions / Restrictions Precautions Precautions: Fall Restrictions Weight Bearing Restrictions: No      Mobility  Bed Mobility Overal bed mobility: Needs Assistance Bed Mobility: Supine to Sit     Supine to sit: Mod assist          Transfers Overall transfer level: Needs assistance Equipment used: Rolling walker (2 wheels) Transfers: Sit to/from Stand, Bed to chair/wheelchair/BSC Sit to Stand: Mod assist   Step pivot transfers: Mod assist       General transfer comment: unsteady labored movement with limited use of LUE due to recent humeral fracture    Ambulation/Gait Ambulation/Gait assistance: Mod assist, Max assist Gait Distance (Feet): 4 Feet Assistive device: Rolling walker (2 wheels) Gait Pattern/deviations: Decreased step length - right, Decreased step length - left, Decreased stride length Gait velocity: slow     General Gait Details: limited to a few unsteady short steps with most of upper body weight on RUE using RW  Stairs            Wheelchair Mobility     Tilt Bed    Modified Rankin (Stroke Patients Only)  Balance Overall balance assessment: Needs assistance Sitting-balance support: Feet supported, No upper extremity supported Sitting balance-Leahy Scale: Fair Sitting balance - Comments: fair/good seated at EOB   Standing balance support: Reliant on assistive device for balance, During functional activity, Single extremity supported Standing balance-Leahy Scale: Poor Standing balance comment: using RW with RUE                              Pertinent Vitals/Pain Pain Assessment Pain Assessment: Faces Faces Pain Scale: Hurts a little bit Pain Location: left shoulder Pain Descriptors / Indicators: Discomfort, Sore Pain Intervention(s): Limited activity within patient's tolerance, Monitored during session, Repositioned    Home Living Family/patient expects to be discharged to:: Private residence Living Arrangements: Spouse/significant other Available Help at Discharge: Family;Available PRN/intermittently               Additional Comments: Patient is poor historian    Prior Function Prior Level of Function : Needs assist       Physical Assist : Mobility (physical);ADLs (physical) Mobility (physical): Bed mobility;Transfers;Gait;Stairs   Mobility Comments: house hold ambulator ADLs Comments: Assisted by family     Extremity/Trunk Assessment   Upper Extremity Assessment Upper Extremity Assessment: Generalized weakness    Lower Extremity Assessment Lower Extremity Assessment: Generalized weakness    Cervical / Trunk Assessment Cervical / Trunk Assessment: Normal  Communication   Communication Communication: Other (comment) (unable to answer most questions about home setting) Cueing Techniques: Verbal cues;Tactile cues  Cognition Arousal: Alert Behavior During Therapy: WFL for tasks assessed/performed Overall Cognitive Status: History of cognitive impairments - at baseline                                          General Comments      Exercises     Assessment/Plan    PT Assessment Patient needs continued PT services  PT Problem List Decreased strength;Decreased activity tolerance;Decreased balance;Decreased mobility       PT Treatment Interventions DME instruction;Gait training;Stair training;Functional mobility training;Therapeutic activities;Therapeutic exercise;Balance training;Patient/family education    PT Goals (Current goals can be found in the Care Plan  section)  Acute Rehab PT Goals Patient Stated Goal: return home with family to assist PT Goal Formulation: With patient Time For Goal Achievement: 02/10/23 Potential to Achieve Goals: Good    Frequency Min 3X/week     Co-evaluation               AM-PAC PT "6 Clicks" Mobility  Outcome Measure Help needed turning from your back to your side while in a flat bed without using bedrails?: A Lot Help needed moving from lying on your back to sitting on the side of a flat bed without using bedrails?: A Lot Help needed moving to and from a bed to a chair (including a wheelchair)?: A Lot Help needed standing up from a chair using your arms (e.g., wheelchair or bedside chair)?: A Lot Help needed to walk in hospital room?: A Lot Help needed climbing 3-5 steps with a railing? : Total 6 Click Score: 11    End of Session   Activity Tolerance: Patient tolerated treatment well;Patient limited by fatigue Patient left: in chair;with call bell/phone within reach;with chair alarm set Nurse Communication: Mobility status PT Visit Diagnosis: Unsteadiness on feet (R26.81);Other abnormalities of gait and mobility (R26.89);Muscle weakness (generalized) (M62.81)  Time: 0927-1001 PT Time Calculation (min) (ACUTE ONLY): 34 min   Charges:   PT Evaluation $PT Eval Moderate Complexity: 1 Mod PT Treatments $Therapeutic Activity: 23-37 mins PT General Charges $$ ACUTE PT VISIT: 1 Visit         1:32 PM, 01/27/23 Ocie Bob, MPT Physical Therapist with [facility] [phone] office [phone] mobile phone

## 2023-01-27 NOTE — [place] FL2 (Signed)
[place] MEDICAID FL2 LEVEL OF CARE FORM     IDENTIFICATION  Patient Name: Brad Shaw Birthdate: 05/01/1946 Sex: male Admission Date (Current Location): 01/25/2023  [facility] and [place] Number:  [employer] and Address:  [facility],  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) 01/26/2023   Sepsis due to undetermined organism (HCC) 01/26/2023   UTI (urinary tract infection) 01/26/2023   Major neurocognitive disorder (HCC) 01/26/2023   Sepsis due to urinary tract infection (HCC) 01/25/2023   Malignant neoplasm of prostate (HCC) 03/19/2021   Alzheimer disease (HCC) 01/17/2021   Low back pain, unspecified 05/01/2020   Memory disturbance 02/14/2019   Acute low back pain 07/08/2017   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    Orientation RESPIRATION BLADDER Height & Weight     Self  O2 (2L) Incontinent Weight: 195 lb (88.5 kg) Height:  6' (182.9 cm)  BEHAVIORAL SYMPTOMS/MOOD NEUROLOGICAL BOWEL NUTRITION STATUS      Continent Diet  AMBULATORY STATUS COMMUNICATION OF NEEDS Skin   Extensive Assist Verbally Normal                       Personal Care Assistance Level of Assistance  Bathing, Feeding, Dressing Bathing Assistance: Limited assistance Feeding assistance: Independent Dressing Assistance: Limited assistance     Functional Limitations Info  Sight, Speech, Hearing Sight  Info: Adequate Hearing Info: Adequate Speech Info: Adequate    SPECIAL CARE FACTORS FREQUENCY  PT (By licensed PT), OT (By licensed OT)     PT Frequency: 5 times weekly OT Frequency: 5 times weekly            Contractures Contractures Info: Not present    Additional Factors Info  Code Status, Allergies Code Status Info: DNR Allergies Info: Cefdinir           Current Medications (01/27/2023):  This is the current hospital active medication list Current Facility-Administered Medications  Medication Dose Route Frequency Provider Last Rate Last Admin   acetaminophen (TYLENOL) tablet 650 mg  650 mg Oral Q6H PRN Adefeso, Oladapo, DO   650 mg at 01/26/23 1548   Or   acetaminophen (TYLENOL) suppository 650 mg  650 mg Rectal Q6H PRN Adefeso, Oladapo, DO       atorvastatin (LIPITOR) tablet 40 mg  40 mg Oral q1800 Adefeso, Oladapo, DO   40 mg at 01/26/23 1720   donepezil (ARICEPT) tablet 10 mg  10 mg Oral QHS Adefeso, Oladapo, DO   10 mg at 01/26/23 2230   enoxaparin (LOVENOX) injection 40 mg  40 mg Subcutaneous Daily Adefeso, Oladapo, DO   40 mg at 01/27/23 0902   feeding supplement (GLUCERNA SHAKE) (GLUCERNA SHAKE) liquid 237 mL  237 mL Oral TID BM Adefeso, Oladapo, DO   237 mL at 01/27/23 0902   finasteride (PROSCAR) tablet 5 mg  5 mg Oral Daily Tat, Onalee Hua, MD   5 mg  at 01/27/23 0902   memantine (NAMENDA) tablet 10 mg  10 mg Oral BID Adefeso, Oladapo, DO   10 mg at 01/27/23 0902   meropenem (MERREM) 1 g in sodium chloride 0.9 % 100 mL IVPB  1 g Intravenous Andres Labrum, MD 200 mL/hr at 01/27/23 0627 1 g at 01/27/23 0627   ondansetron (ZOFRAN) tablet 4 mg  4 mg Oral Q6H PRN Adefeso, Oladapo, DO       Or   ondansetron (ZOFRAN) injection 4 mg  4 mg Intravenous Q6H PRN Adefeso, Oladapo, DO       phosphorus (K PHOS NEUTRAL) tablet 500 mg  500 mg Oral BID Tat, David, MD   500 mg at 01/27/23 0902   sertraline (ZOLOFT) tablet 50 mg  50 mg Oral Daily Tat, Onalee Hua, MD   50 mg at 01/27/23  0902     Discharge Medications: Please see discharge summary for a list of discharge medications.  Relevant Imaging Results:  Relevant Lab Results:   Additional Information SSN: [id number]  [place], [place]

## 2023-01-27 NOTE — TOC Progression Note (Signed)
Transition of Care [facility]) - Progression Note    Patient Details  Name: Brad Shaw MRN: [id number] Date of Birth: 06-12-1945  Transition of Care [facility]) CM/SW Contact  [place], [place] Phone Number: 01/27/2023, 3:53 PM  Clinical Narrative:    SNF referral sent to [facility] at wife's request for review. CSW updated Belenda Cruise in admissions who will review. TOC to follow.   Expected Discharge Plan: Skilled Nursing Facility Barriers to Discharge: Continued Medical Work up  Expected Discharge Plan and Services In-house Referral: Clinical Social Work Discharge Planning Services: CM Consult Post Acute Care Choice: Skilled Nursing Facility Living arrangements for the past 2 months: Single Family Home                                       Social Determinants of Health (SDOH) Interventions SDOH Screenings   Food Insecurity: No Food Insecurity (10/29/2022)   Received from [facility], [facility]  Transportation Needs: No Transportation Needs (10/29/2022)   Received from [facility], [facility]  Utilities: Not At Risk (10/29/2022)   Received from [facility], [facility]  Financial Resource Strain: Low Risk  (10/29/2022)   Received from [facility], [facility]  Physical Activity: Insufficiently Active (10/29/2022)   Received from [facility], [facility]  Social Connections: Moderately Integrated (10/29/2022)   Received from [facility], [facility]  Stress: No Stress Concern Present (10/29/2022)   Received from [facility], [facility]  Tobacco Use: Low Risk  (01/25/2023)    Readmission Risk Interventions     No data to display

## 2023-01-27 NOTE — Plan of Care (Signed)

## 2023-01-27 NOTE — Progress Notes (Signed)
PT Cancellation Note  Patient Details Name: Brad Shaw MRN: [id number] DOB: 1945-12-17   Cancelled Treatment:    Reason Eval/Treat Not Completed: Patient declined, no reason specified (Pt wishing to hold on evaluation until bedside swallow study performed by RN.)  Pt wishing to not mobilize until has something to eat and drink. Pt was made aware that nursing will need to clear bedside swallow prior to given pt liquids.   PT will follow and evaluate as schedule permits.  Nelida Meuse 01/27/2023, 8:53 AM

## 2023-01-28 DIAGNOSIS — A419 Sepsis, unspecified organism: Secondary | ICD-10-CM | POA: Diagnosis not present

## 2023-01-28 DIAGNOSIS — N390 Urinary tract infection, site not specified: Secondary | ICD-10-CM | POA: Diagnosis not present

## 2023-01-28 LAB — BASIC METABOLIC PANEL
Anion gap: 6 (ref 5–15)
BUN: 27 mg/dL — ABNORMAL HIGH (ref 8–23)
CO2: 26 mmol/L (ref 22–32)
Calcium: 8.8 mg/dL — ABNORMAL LOW (ref 8.9–10.3)
Chloride: 107 mmol/L (ref 98–111)
Creatinine, Ser: 1 mg/dL (ref 0.61–1.24)
GFR, Estimated: >60 mL/min (ref >60)
Glucose, Bld: 91 mg/dL (ref 70–99)
Potassium: 3.6 mmol/L (ref 3.5–5.1)
Sodium: 139 mmol/L (ref 135–145)

## 2023-01-28 LAB — GLUCOSE, CAPILLARY
Glucose-Capillary: 134 mg/dL — ABNORMAL HIGH (ref 70–99)
Glucose-Capillary: 187 mg/dL — ABNORMAL HIGH (ref 70–99)
Glucose-Capillary: 144 mg/dL — ABNORMAL HIGH (ref 70–99)
Glucose-Capillary: 131 mg/dL — ABNORMAL HIGH (ref 70–99)

## 2023-01-28 LAB — CBC
HCT: 32.5 % — ABNORMAL LOW (ref 39.0–52.0)
Hemoglobin: 10.7 g/dL — ABNORMAL LOW (ref 13.0–17.0)
MCH: 30.6 pg (ref 26.0–34.0)
MCHC: 32.9 g/dL (ref 30.0–36.0)
MCV: 92.9 fL (ref 80.0–100.0)
Platelets: 249 10*3/uL (ref 150–400)
RBC: 3.5 MIL/uL — ABNORMAL LOW (ref 4.22–5.81)
RDW: 15.8 % — ABNORMAL HIGH (ref 11.5–15.5)
WBC: 7.7 10*3/uL (ref 4.0–10.5)
nRBC: 0 % (ref 0.0–0.2)

## 2023-01-28 LAB — URINE CULTURE: Culture: >=100000 — AB

## 2023-01-28 LAB — MAGNESIUM: Magnesium: 2.1 mg/dL (ref 1.7–2.4)

## 2023-01-28 MED ORDER — SODIUM CHLORIDE 0.9 % IV SOLN
1.0000 g | Freq: Four times a day (QID) | INTRAVENOUS | Status: DC
  Administered 2023-01-28 – 2023-01-29 (×3): 1 g via INTRAVENOUS
  Filled 2023-01-28 (×9): qty 1000

## 2023-01-28 NOTE — Care Management Important Message (Signed)
Important Message  Patient Details  Name: Brad Shaw MRN: [id number] Date of Birth: 20-Apr-1946   Medicare Important Message Given:  N/A - LOS <3 / Initial given by admissions     Corey Harold 01/28/2023, 9:41 AM

## 2023-01-28 NOTE — TOC Progression Note (Signed)
Transition of Care [facility]) - Progression Note    Patient Details  Name: Brad Shaw MRN: [id number] Date of Birth: 02/01/1946  Transition of Care [facility]) CM/SW Contact  [place], [place] Phone Number: 01/28/2023, 11:38 AM  Clinical Narrative:    CSW spoke to [place] in admissions with [facility] who states they can accept pt tomorrow if medically stable. TOC to follow.   Expected Discharge Plan: Skilled Nursing Facility Barriers to Discharge: Continued Medical Work up  Expected Discharge Plan and Services In-house Referral: Clinical Social Work Discharge Planning Services: CM Consult Post Acute Care Choice: Skilled Nursing Facility Living arrangements for the past 2 months: Single Family Home                                       Social Determinants of Health (SDOH) Interventions SDOH Screenings   Food Insecurity: No Food Insecurity (10/29/2022)   Received from [facility], [facility]  Transportation Needs: No Transportation Needs (10/29/2022)   Received from [facility], [facility]  Utilities: Not At Risk (10/29/2022)   Received from [facility], [facility]  Financial Resource Strain: Low Risk  (10/29/2022)   Received from [facility], [facility]  Physical Activity: Insufficiently Active (10/29/2022)   Received from [facility], [facility]  Social Connections: Moderately Integrated (10/29/2022)   Received from [facility], [facility]  Stress: No Stress Concern Present (10/29/2022)   Received from [facility], [facility]  Tobacco Use: Low Risk  (01/25/2023)    Readmission Risk Interventions     No data to display

## 2023-01-28 NOTE — [place] FL2 (Signed)
[place] MEDICAID FL2 LEVEL OF CARE FORM     IDENTIFICATION  Patient Name: Brad Shaw Birthdate: [birth year] Sex: male Admission Date (Current Location): 01/25/2023  [facility] and [place] Number:  [employer] and Address:  [facility],  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) 01/26/2023   Sepsis due to undetermined organism (HCC) 01/26/2023   UTI (urinary tract infection) 01/26/2023   Major neurocognitive disorder (HCC) 01/26/2023   Sepsis due to urinary tract infection (HCC) 01/25/2023   Malignant neoplasm of prostate (HCC) 03/19/2021   Alzheimer disease (HCC) 01/17/2021   Low back pain, unspecified 05/01/2020   Memory disturbance 02/14/2019   Acute low back pain 07/08/2017   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    Orientation RESPIRATION BLADDER Height & Weight     Self  O2 (2L) Incontinent Weight: 195 lb (88.5 kg) Height:  6' (182.9 cm)  BEHAVIORAL SYMPTOMS/MOOD NEUROLOGICAL BOWEL NUTRITION STATUS      Continent Diet  AMBULATORY STATUS COMMUNICATION OF NEEDS Skin   Extensive Assist Verbally Normal                       Personal Care Assistance Level of Assistance  Bathing, Feeding, Dressing Bathing Assistance: Limited assistance Feeding assistance: Independent Dressing Assistance: Limited assistance     Functional Limitations Info  Sight, Speech,  Hearing Sight Info: Adequate Hearing Info: Adequate Speech Info: Adequate    SPECIAL CARE FACTORS FREQUENCY  PT (By licensed PT), OT (By licensed OT)     PT Frequency: 5 times weekly OT Frequency: 5 times weekly            Contractures Contractures Info: Not present    Additional Factors Info  Code Status, Allergies Code Status Info: DNR Allergies Info: Cefdinir           Current Medications (01/28/2023):  This is the current hospital active medication list Current Facility-Administered Medications  Medication Dose Route Frequency Provider Last Rate Last Admin   acetaminophen (TYLENOL) tablet 650 mg  650 mg Oral Q6H PRN Adefeso, Oladapo, DO   650 mg at 01/26/23 1548   Or   acetaminophen (TYLENOL) suppository 650 mg  650 mg Rectal Q6H PRN Adefeso, Oladapo, DO       ampicillin (OMNIPEN) 1 g in sodium chloride 0.9 % 100 mL IVPB  1 g Intravenous Q6H Tyrone Nine, MD       atorvastatin (LIPITOR) tablet 40 mg  40 mg Oral q1800 Adefeso, Oladapo, DO   40 mg at 01/27/23 1732   donepezil (ARICEPT) tablet 10 mg  10 mg Oral QHS Adefeso, Oladapo, DO   10 mg at 01/27/23 2215   enoxaparin (LOVENOX) injection 40 mg  40 mg Subcutaneous Daily Adefeso, Oladapo, DO   40 mg at 01/28/23 0935   feeding supplement (GLUCERNA SHAKE) (GLUCERNA SHAKE) liquid 237 mL  237 mL Oral TID BM  Frankey Shown, DO   237 mL at 01/27/23 2215   finasteride (PROSCAR) tablet 5 mg  5 mg Oral Daily Tat, David, MD   5 mg at 01/28/23 0935   memantine (NAMENDA) tablet 10 mg  10 mg Oral BID Adefeso, Oladapo, DO   10 mg at 01/28/23 0935   ondansetron (ZOFRAN) tablet 4 mg  4 mg Oral Q6H PRN Adefeso, Oladapo, DO       Or   ondansetron (ZOFRAN) injection 4 mg  4 mg Intravenous Q6H PRN Adefeso, Oladapo, DO   4 mg at 01/27/23 2216   phosphorus (K PHOS NEUTRAL) tablet 500 mg  500 mg Oral BID Tat, Onalee Hua, MD   500 mg at 01/28/23 0935   sertraline (ZOLOFT) tablet 50 mg  50 mg Oral Daily Tat, Onalee Hua, MD   50 mg at 01/28/23 4742      Discharge Medications: Please see discharge summary for a list of discharge medications.  Relevant Imaging Results:  Relevant Lab Results:   Additional Information SSN: [id number]  [place], [place]

## 2023-01-28 NOTE — Progress Notes (Signed)
[facility] PROGRESS NOTE  Brad Shaw (DOB: [birth year]) NGE:[id number] PCP: Joycelyn Rua, MD  Brief Narrative: 77 year old male with a history of hypertension, hyperlipidemia, Alzheimer's dementia, prostate cancer, and urinary incontinence presenting with generalized weakness, malaise, and vomiting.  The patient had 4 episodes of nausea and vomiting on 01/22/2023.  This seems to have resolved without any further episodes.  There was no hematemesis.  On 01/23/2023, the patient and spouse noted the patient had decreased oral intake and generalized weakness.  This had progressed over the next 2 days.  She noted that he was a little bit more sleepy than usual and had difficulty getting in and out of bed secondary to his generalized weakness.  She noted that the patient began developing a fever up to 101.0 F on 01/24/2023.  This continued throughout the day.  His fever was up to 103.0 F on the morning of 01/25/2023.  As result, the patient was brought to emergency department for further evaluation treatment.  There has been no complaints of chest pain, shortness breath, coughing, hematemesis, diarrhea, abdominal pain.  There is no new medications. In the ED, the patient was febrile up to 103.0 F.  He was tachycardic up to 111, but remained hemodynamically stable.  Oxygen saturation was 87% on room air.  The patient was placed on supplemental oxygen at 2 L with saturation up to 97%.  WBC 16.9, hemoglobin 11.5, platelets 274,000.  Sodium 136, potassium 3.7, bicarbonate 23, serum creatinine 0.87.  LFTs were unremarkable.  Chest x-ray was negative for infiltrates.  UA>50 WBC.  Lactic acid peaked at 1.5.  COVID-19 PCR was negative.  Blood and urine cultures were sent.  The patient was started on levofloxacin in ED   Subjective: No complaints, feels fine. States he has a son but can't remember his name.  Objective: BP 110/64 (BP Location: Right Arm)   Pulse 79   Temp 98.5 F (36.9 C)   Resp 16   Ht  6' (1.829 m)   Wt 88.5 kg   SpO2 97%   BMI 26.45 kg/m   Gen: No distress, elderly Pulm: Clear, nonlabored  CV: RRR, no MRG or edema GI: Soft, NT, ND, +BS Neuro: Alert and disoriented, interactive. No new focal deficits. Ext: Warm, no deformities Skin: No rashes, lesions or ulcers on visualized skin   Assessment & Plan: Sepsis due to E. faecalis UTI:  - Transition to ampicillin, then amoxicillin if tolerated (cefdinir allergy noted though reportedly received ceftriaxone en route to hospital and had keflex prescribed 2020).  - Monitor to ensure no fever x24 hrs.  - Blood cultures NGTD x3d.   Hypoxia due to scarring/subsegmental atelectasis. No PE on CTA.  - Wean to room air - Incentive spirometry   Essential hypertension -Holding amlodipine and lisinopril temporarily. Remains normotensive. -Monitor vital signs   Dementia:  -Continue Aricept and Namenda   HLD: -Continue atorvastatin   Hypokalemia -Repleted, resolved -Magnesium 1.9   Hypophosphatemia -Repleted   Left humerus fracture history -due to fall on 12/20/22 -follow up with [facility] -full ROM of hand and wrist only   Tyrone Nine, MD [facility] [email or web address] 01/28/2023, 6:41 PM

## 2023-01-29 DIAGNOSIS — M6281 Muscle weakness (generalized): Secondary | ICD-10-CM | POA: Diagnosis not present

## 2023-01-29 DIAGNOSIS — R262 Difficulty in walking, not elsewhere classified: Secondary | ICD-10-CM | POA: Diagnosis not present

## 2023-01-29 DIAGNOSIS — A419 Sepsis, unspecified organism: Secondary | ICD-10-CM | POA: Diagnosis not present

## 2023-01-29 DIAGNOSIS — S42302D Unspecified fracture of shaft of humerus, left arm, subsequent encounter for fracture with routine healing: Secondary | ICD-10-CM | POA: Diagnosis not present

## 2023-01-29 DIAGNOSIS — N401 Enlarged prostate with lower urinary tract symptoms: Secondary | ICD-10-CM | POA: Diagnosis not present

## 2023-01-29 DIAGNOSIS — E440 Moderate protein-calorie malnutrition: Secondary | ICD-10-CM | POA: Diagnosis not present

## 2023-01-29 DIAGNOSIS — G309 Alzheimer's disease, unspecified: Secondary | ICD-10-CM | POA: Diagnosis not present

## 2023-01-29 DIAGNOSIS — G4700 Insomnia, unspecified: Secondary | ICD-10-CM | POA: Diagnosis not present

## 2023-01-29 DIAGNOSIS — I10 Essential (primary) hypertension: Secondary | ICD-10-CM | POA: Diagnosis not present

## 2023-01-29 DIAGNOSIS — E876 Hypokalemia: Secondary | ICD-10-CM | POA: Diagnosis not present

## 2023-01-29 DIAGNOSIS — F0390 Unspecified dementia without behavioral disturbance: Secondary | ICD-10-CM | POA: Diagnosis not present

## 2023-01-29 DIAGNOSIS — R32 Unspecified urinary incontinence: Secondary | ICD-10-CM | POA: Diagnosis not present

## 2023-01-29 DIAGNOSIS — E785 Hyperlipidemia, unspecified: Secondary | ICD-10-CM | POA: Diagnosis not present

## 2023-01-29 DIAGNOSIS — F0280 Dementia in other diseases classified elsewhere without behavioral disturbance: Secondary | ICD-10-CM | POA: Diagnosis not present

## 2023-01-29 DIAGNOSIS — Z9181 History of falling: Secondary | ICD-10-CM | POA: Diagnosis not present

## 2023-01-29 DIAGNOSIS — N400 Enlarged prostate without lower urinary tract symptoms: Secondary | ICD-10-CM | POA: Diagnosis not present

## 2023-01-29 DIAGNOSIS — R41841 Cognitive communication deficit: Secondary | ICD-10-CM | POA: Diagnosis not present

## 2023-01-29 DIAGNOSIS — N390 Urinary tract infection, site not specified: Secondary | ICD-10-CM | POA: Diagnosis not present

## 2023-01-29 DIAGNOSIS — Z8546 Personal history of malignant neoplasm of prostate: Secondary | ICD-10-CM | POA: Diagnosis not present

## 2023-01-29 DIAGNOSIS — E782 Mixed hyperlipidemia: Secondary | ICD-10-CM | POA: Diagnosis not present

## 2023-01-29 DIAGNOSIS — R2681 Unsteadiness on feet: Secondary | ICD-10-CM | POA: Diagnosis not present

## 2023-01-29 LAB — GLUCOSE, CAPILLARY
Glucose-Capillary: 119 mg/dL — ABNORMAL HIGH (ref 70–99)
Glucose-Capillary: 122 mg/dL — ABNORMAL HIGH (ref 70–99)

## 2023-01-29 MED ORDER — ORAL CARE MOUTH RINSE: 15.0000 mL | OROMUCOSAL | Status: DC | PRN

## 2023-01-29 MED ORDER — NAPROXEN 500 MG PO TABS: 500.0000 mg | ORAL_TABLET | Freq: Two times a day (BID) | ORAL | Status: AC | PRN

## 2023-01-29 MED ORDER — AMOXICILLIN 250 MG PO CAPS
500.0000 mg | ORAL_CAPSULE | Freq: Three times a day (TID) | ORAL | Status: DC
  Administered 2023-01-29: 500 mg via ORAL
  Filled 2023-01-29 (×2): qty 2

## 2023-01-29 MED ORDER — AMOXICILLIN 500 MG PO CAPS: 500.0000 mg | ORAL_CAPSULE | Freq: Three times a day (TID) | ORAL | Status: AC

## 2023-01-29 NOTE — TOC Transition Note (Signed)
Transition of Care [facility]) - CM/SW Discharge Note   Patient Details  Name: Brad Shaw MRN: [id number] Date of Birth: 04/11/1946  Transition of Care [facility]) CM/SW Contact:  [place], LCSWA Phone Number: 01/29/2023, 12:41 PM   Clinical Narrative:    CSW updated pt is medically stable for D/C to [facility] today. CSW updated Belenda Cruise in admissions who states they are able to accept today. D/C clinicals sent to facility in the HUB. Pts wife updated. RN provided with room and report numbers. [facility] transport called. TOC signing off.   Final next level of care: Skilled Nursing Facility Barriers to Discharge: Barriers Resolved   Patient Goals and CMS Choice CMS Medicare.gov Compare Post Acute Care list provided to:: Patient Represenative (must comment) Choice offered to / list presented to : Spouse  Discharge Placement                Patient chooses bed at: [facility] Patient to be transferred to facility by: [facility] Name of family member notified: Wife Patient and family notified of of transfer: 01/29/23  Discharge Plan and Services Additional resources added to the After Visit Summary for   In-house Referral: Clinical Social Work Discharge Planning Services: CM Consult Post Acute Care Choice: Skilled Nursing Facility                               Social Determinants of Health (SDOH) Interventions SDOH Screenings   Food Insecurity: No Food Insecurity (10/29/2022)   Received from [facility], [facility]  Transportation Needs: No Transportation Needs (10/29/2022)   Received from [facility], [facility]  Utilities: Not At Risk (10/29/2022)   Received from [facility], [facility]  Financial Resource Strain: Low Risk  (10/29/2022)   Received from [facility], [facility]  Physical Activity: Insufficiently Active (10/29/2022)   Received from [facility], [facility]  Social Connections: Moderately Integrated (10/29/2022)   Received from  [facility], [facility]  Stress: No Stress Concern Present (10/29/2022)   Received from [facility], [facility]  Tobacco Use: Low Risk  (01/25/2023)     Readmission Risk Interventions     No data to display

## 2023-01-29 NOTE — Care Management Important Message (Signed)
Important Message  Patient Details  Name: Brad Shaw MRN: [id number] Date of Birth: 1946-04-19   Medicare Important Message Given:  Yes     Corey Harold 01/29/2023, 10:19 AM

## 2023-01-29 NOTE — Discharge Summary (Addendum)
Physician Discharge Summary   Patient: Brad Shaw MRN: [id number] DOB: 10-04-1945  Admit date:     01/25/2023  Discharge date: 01/29/23  Discharge Physician: Tyrone Nine   PCP: Joycelyn Rua, MD   Recommendations at discharge:  Suggest monitoring of BMP, Mg, Phos Monitor BP. Holding norvasc, lisinopril while admitted and remains normotensive.  Can follow up for left humerus fracture with [facility].  Suggest continued PCP evaluations.  Discharge Diagnoses: Principal Problem:   Sepsis due to urinary tract infection (HCC) Active Problems:   Alzheimer disease (HCC)   Hypokalemia   Hyperglycemia   Essential hypertension   Mixed hyperlipidemia   Hypoalbuminemia due to protein-calorie malnutrition (HCC)   Sepsis due to undetermined organism [facility])   UTI (urinary tract infection)   Major neurocognitive disorder [facility])  Hospital Course: 77 year old male with a history of hypertension, hyperlipidemia, Alzheimer's dementia, prostate cancer, and urinary incontinence presenting with generalized weakness, malaise, and vomiting.  The patient had 4 episodes of nausea and vomiting on 01/22/2023.  This seems to have resolved without any further episodes.  There was no hematemesis.  On 01/23/2023, the patient and spouse noted the patient had decreased oral intake and generalized weakness.  This had progressed over the next 2 days.  She noted that he was a little bit more sleepy than usual and had difficulty getting in and out of bed secondary to his generalized weakness.  She noted that the patient began developing a fever up to 101.0 F on 01/24/2023.  This continued throughout the day.  His fever was up to 103.0 F on the morning of 01/25/2023.  As result, the patient was brought to emergency department for further evaluation treatment.  There has been no complaints of chest pain, shortness breath, coughing, hematemesis, diarrhea, abdominal pain.  There is no new medications. In the ED, the  patient was febrile up to 103.0 F.  He was tachycardic up to 111, but remained hemodynamically stable.  Oxygen saturation was 87% on room air.  The patient was placed on supplemental oxygen at 2 L with saturation up to 97%.  WBC 16.9, hemoglobin 11.5, platelets 274,000.  Sodium 136, potassium 3.7, bicarbonate 23, serum creatinine 0.87.  LFTs were unremarkable.  Chest x-ray was negative for infiltrates.  UA>50 WBC.  Lactic acid peaked at 1.5.  COVID-19 PCR was negative.  Blood and urine cultures were sent.  The patient was started on levofloxacin in ED  Urine culture grew E. faecalis which responded well to ampicillin per susceptibility data. Fever curve has improved and he is stable for discharge to SNF per PT recommendations.   Assessment and Plan: Sepsis due to E. faecalis UTI:  - Has tolerated ampicillin, will change to amoxicillin. (cefdinir allergy noted though reportedly received ceftriaxone en route to hospital and had keflex prescribed 2020).  - Last febrile 8/27.  - Blood cultures NGTD x4d.   Hypoxia due to scarring/subsegmental atelectasis. No PE on CTA.  - Weaned to room air - Continue incentive spirometry   Essential hypertension -Holding amlodipine and lisinopril at DC. Remains normotensive. -Monitor vital signs at SNF   Dementia:  -Continue Aricept and Namenda   HLD: -Continue atorvastatin   Hypokalemia -Repleted, resolved -Magnesium 1.9   Hypophosphatemia -Repleted   Left humerus fracture history -due to fall on 12/20/22 -follow up with [facility] -full ROM of hand and wrist only  DNR status: Noted, gold form to be sent with patient.  Consultants: None Procedures performed: None  Disposition: Skilled nursing  facility Diet recommendation:  Cardiac diet DISCHARGE MEDICATION: Allergies as of 01/29/2023       Reactions   Cefdinir Rash        Medication List     STOP taking these medications    amLODipine 10 MG tablet Commonly known as:  NORVASC   lisinopril 20 MG tablet Commonly known as: ZESTRIL       TAKE these medications    acetaminophen 500 MG tablet Commonly known as: TYLENOL Take 500-1,000 mg by mouth every 12 (twelve) hours as needed for mild pain, moderate pain, fever or headache.   amoxicillin 500 MG capsule Commonly known as: AMOXIL Take 1 capsule (500 mg total) by mouth every 8 (eight) hours for 5 days.   atorvastatin 40 MG tablet Commonly known as: LIPITOR Take 40 mg by mouth daily at 6 PM.   donepezil 10 MG tablet Commonly known as: ARICEPT Take 1 at bedtime   finasteride 5 MG tablet Commonly known as: PROSCAR Take 5 mg by mouth daily.   Melatonin 5 MG Caps Take 5 mg by mouth daily.   memantine 10 MG tablet Commonly known as: NAMENDA Take 1 tablet (10 mg total) by mouth 2 (two) times daily.   Multi Vitamin Tabs Take 1 tablet by mouth daily.   naproxen 500 MG tablet Commonly known as: NAPROSYN Take 1 tablet (500 mg total) by mouth 2 (two) times daily as needed for moderate pain. What changed:  when to take this reasons to take this   sertraline 50 MG tablet Commonly known as: ZOLOFT Take 50 mg by mouth daily.   TURMERIC PO Take 1 capsule by mouth daily.   Vitamin D3 50 MCG (2000 UT) Tabs Take 50 mcg by mouth daily.        Contact information for follow-up providers     Joycelyn Rua, MD Follow up.   Specialty: Family Medicine Contact information: [address] [place] [address] [phone]         [facility] Follow up.   Contact information: [facility] [address] [place] [address] [phone]              Contact information for after-discharge care     Destination     [facility] Preferred SNF .   Service: Skilled Nursing Contact information: [address] [place] [address] [phone]                     Discharge Exam: Ceasar Mons Weights   01/25/23 1825  Weight: 88.5 kg  BP 133/67 (BP Location: Left Arm)   Pulse 76   Temp 98 F (36.7 C)   Resp 16   Ht 6' (1.829 m)   Wt 88.5 kg   SpO2 94%   BMI 26.45 kg/m   Well-appearing elderly male in no acute distress Clear, nonlabored RRR, no MRG or edema Alert, not oriented, no focal deficits. Eating breakfast.  Condition at discharge: stable  The results of significant diagnostics from this hospitalization (including imaging, microbiology, ancillary and laboratory) are listed below for reference.   Imaging Studies: CT Angio Chest Pulmonary Embolism (PE) W or WO Contrast  Result Date: 01/27/2023 CLINICAL DATA:  Pulmonary embolism (PE) suspected, low to intermediate prob, neg D-dimer hypoxia, elevated d-dimer EXAM: CT ANGIOGRAPHY CHEST WITH CONTRAST TECHNIQUE: Multidetector CT imaging of the chest was performed using the standard protocol during bolus administration of intravenous contrast. Multiplanar  CT image reconstructions and MIPs were obtained to evaluate the vascular anatomy. RADIATION DOSE REDUCTION: This exam was performed according to the departmental dose-optimization program which includes automated exposure control, adjustment of the mA and/or kV according to patient size and/or use of iterative reconstruction technique. CONTRAST:  75mL OMNIPAQUE IOHEXOL 350 MG/ML SOLN COMPARISON:  the previous day's study FINDINGS: Cardiovascular: SVC patent. Heart size normal. No pericardial effusion. Satisfactory opacification of pulmonary arteries noted, and there is no evidence of pulmonary emboli. Scattered coronary calcifications. Coarse aortic valve leaflet calcifications. Adequate contrast opacification of the thoracic aorta with no evidence of dissection, aneurysm, or stenosis. There is classic 3-vessel brachiocephalic arch anatomy without proximal stenosis. Scattered calcified plaque in the arch and descending thoracic aorta.  Mediastinum/Nodes: No mass or adenopathy. Lungs/Pleura: No pleural effusion. No pneumothorax. Linear scarring or subsegmental atelectasis at the lung bases right greater than left as before. Upper Abdomen: 1.3 cm partially calcified gallstone. No acute findings. Musculoskeletal: Vertebral endplate spurring at multiple levels in the mid and lower thoracic spine. Spondylitic changes in the visualized lower cervical spine. Impacted comminuted fracture of the proximal left humerus as before. Review of the MIP images confirms the above findings. IMPRESSION: 1. Negative for acute PE or thoracic aortic dissection. 2. Bibasilar scarring or subsegmental atelectasis. 3. Cholelithiasis. 4. Coronary and aortic Atherosclerosis (ICD10-I70.0). Electronically Signed   By: Corlis Leak M.D.   On: 01/27/2023 17:00   CT CHEST WO CONTRAST  Result Date: 01/26/2023 CLINICAL DATA:  Respiratory illness. Generalized weakness and fever with hypoxia and dyspnea. EXAM: CT CHEST WITHOUT CONTRAST TECHNIQUE: Multidetector CT imaging of the chest was performed following the standard protocol without IV contrast. RADIATION DOSE REDUCTION: This exam was performed according to the departmental dose-optimization program which includes automated exposure control, adjustment of the mA and/or kV according to patient size and/or use of iterative reconstruction technique. COMPARISON:  Remote PET-CT from 01/23/2021 FINDINGS: Cardiovascular: Heart size is normal. Aortic atherosclerosis with coronary artery calcifications. No pericardial effusion. Mediastinum/Nodes: No enlarged mediastinal or axillary lymph nodes. Thyroid gland, trachea, and esophagus demonstrate no significant findings. Lungs/Pleura: There is no pleural effusion or airspace consolidation. No interstitial edema. No signs of pneumothorax. No suspicious pulmonary nodule or mass identified. Pleural thickening is noted of overlying the posterior lower lobes. Subsegmental atelectasis and volume  loss is identified within the right middle lobe and right lower lobe. There is also mild subsegmental atelectasis versus scarring within the posterior left lower lobe and lingula. Upper Abdomen: No acute abnormality. Gallstone is identified measuring 1.3 cm. Musculoskeletal: Subacute fracture deformity involving the left humeral neck is again noted. No acute fractures or suspicious bone lesions identified. IMPRESSION: 1. Subsegmental atelectasis and volume loss within the right middle lobe and right lower lobe. There is also mild subsegmental atelectasis versus scarring within the posterior left lower lobe and lingula. 2. No signs of pleural effusion or airspace consolidation. 3. Coronary artery calcifications. 4. Cholelithiasis. 5. Subacute fracture deformity involving the left humeral neck. 6. Aortic Atherosclerosis (ICD10-I70.0). Electronically Signed   By: Signa Kell M.D.   On: 01/26/2023 10:42   DG Chest Port 1 View  Result Date: 01/25/2023 CLINICAL DATA:  Sepsis and fevers EXAM: PORTABLE CHEST 1 VIEW COMPARISON:  07/02/2017 FINDINGS: Cardiac shadow is prominent although extension weighted by the portable technique. Lungs are well aerated without focal infiltrate or effusion. Impacted proximal left humeral fracture is noted stable in appearance from a prior exam from 12/20/2022. No acute bony abnormality is noted. IMPRESSION: Chronic  impacted left humeral fracture. No other focal abnormality is noted. Electronically Signed   By: Alcide Clever M.D.   On: 01/25/2023 19:49    Microbiology: Results for orders placed or performed during the hospital encounter of 01/25/23  Culture, blood (Routine x 2)     Status: None (Preliminary result)   Collection Time: 01/25/23  6:50 PM   Specimen: BLOOD RIGHT HAND  Result Value Ref Range Status   Specimen Description BLOOD RIGHT HAND BLOOD  Final   Special Requests   Final    BOTTLES DRAWN AEROBIC AND ANAEROBIC Blood Culture results may not be optimal due to an  excessive volume of blood received in culture bottles   Culture   Final    NO GROWTH 4 DAYS Performed at [facility], [address] [place], [place] [address]    Report Status PENDING  Incomplete  Culture, blood (Routine x 2)     Status: None (Preliminary result)   Collection Time: 01/25/23  7:05 PM   Specimen: BLOOD LEFT HAND  Result Value Ref Range Status   Specimen Description BLOOD LEFT HAND BLOOD  Final   Special Requests   Final    BOTTLES DRAWN AEROBIC ONLY Blood Culture adequate volume   Culture   Final    NO GROWTH 4 DAYS Performed at [facility], [address], [place], [place] [address]    Report Status PENDING  Incomplete  Resp panel by RT-PCR (RSV, Flu A&B, Covid) Anterior Nasal Swab     Status: None   Collection Time: 01/25/23  8:31 PM   Specimen: Anterior Nasal Swab  Result Value Ref Range Status   SARS Coronavirus 2 by RT PCR NEGATIVE NEGATIVE Final    Comment: (NOTE) SARS-CoV-2 target nucleic acids are NOT DETECTED.  The SARS-CoV-2 RNA is generally detectable in upper respiratory specimens during the acute phase of infection. The lowest concentration of SARS-CoV-2 viral copies this assay can detect is 138 copies/mL. A negative result does not preclude SARS-Cov-2 infection and should not be used as the sole basis for treatment or other patient management decisions. A negative result may occur with  improper specimen collection/handling, submission of specimen other than nasopharyngeal swab, presence of viral mutation(s) within the areas targeted by this assay, and inadequate number of viral copies(<138 copies/mL). A negative result must be combined with clinical observations, patient history, and epidemiological information. The expected result is Negative.  Fact Sheet for Patients:  BloggerCourse.com  Fact Sheet for Healthcare Providers:  SeriousBroker.it  This test is no t yet approved or cleared by the  [place] FDA and  has been authorized for detection and/or diagnosis of SARS-CoV-2 by FDA under an Emergency Use Authorization (EUA). This EUA will remain  in effect (meaning this test can be used) for the duration of the COVID-19 declaration under Section 564(b)(1) of the Act, 21 U.S.C.section 360bbb-3(b)(1), unless the authorization is terminated  or revoked sooner.       Influenza A by PCR NEGATIVE NEGATIVE Final   Influenza B by PCR NEGATIVE NEGATIVE Final    Comment: (NOTE) The Xpert Xpress SARS-CoV-2/FLU/RSV plus assay is intended as an aid in the diagnosis of influenza from Nasopharyngeal swab specimens and should not be used as a sole basis for treatment. Nasal washings and aspirates are unacceptable for Xpert Xpress SARS-CoV-2/FLU/RSV testing.  Fact Sheet for Patients: BloggerCourse.com  Fact Sheet for Healthcare Providers: SeriousBroker.it  This test is not yet approved or cleared by the [place] FDA and has  been authorized for detection and/or diagnosis of SARS-CoV-2 by FDA under an Emergency Use Authorization (EUA). This EUA will remain in effect (meaning this test can be used) for the duration of the COVID-19 declaration under Section 564(b)(1) of the Act, 21 U.S.C. section 360bbb-3(b)(1), unless the authorization is terminated or revoked.     Resp Syncytial Virus by PCR NEGATIVE NEGATIVE Final    Comment: (NOTE) Fact Sheet for Patients: BloggerCourse.com  Fact Sheet for Healthcare Providers: SeriousBroker.it  This test is not yet approved or cleared by the [place] FDA and has been authorized for detection and/or diagnosis of SARS-CoV-2 by FDA under an Emergency Use Authorization (EUA). This EUA will remain in effect (meaning this test can be used) for the duration of the COVID-19 declaration under Section 564(b)(1) of the Act, 21 U.S.C. section  360bbb-3(b)(1), unless the authorization is terminated or revoked.  Performed at [facility], [address], [place], [place] [address]   Urine Culture     Status: Abnormal   Collection Time: 01/25/23  9:50 PM   Specimen: Urine, Random  Result Value Ref Range Status   Specimen Description   Final    URINE, RANDOM Performed at [facility], [address], [place], [place] [address]    Special Requests   Final    NONE Reflexed from [id number] Performed at [facility], [address] [place], [place] [address]    Culture >=100,000 COLONIES/mL ENTEROCOCCUS FAECALIS (A)  Final   Report Status 01/28/2023 FINAL  Final   Organism ID, Bacteria ENTEROCOCCUS FAECALIS (A)  Final      Susceptibility   Enterococcus faecalis - MIC*    AMPICILLIN <=2 SENSITIVE Sensitive     NITROFURANTOIN <=16 SENSITIVE Sensitive     VANCOMYCIN 2 SENSITIVE Sensitive     * >=100,000 COLONIES/mL ENTEROCOCCUS FAECALIS    Labs: CBC: Recent Labs  Lab 01/25/23 1839 01/26/23 0410 01/27/23 0414 01/28/23 0707  WBC 16.9* 15.7* 11.1* 7.7  NEUTROABS 15.1*  --   --   --   HGB 11.5* 10.9* 10.7* 10.7*  HCT 34.4* 32.9* 32.0* 32.5*  MCV 92.0 92.9 91.7 92.9  PLT 274 248 251 249   Basic Metabolic Panel: Recent Labs  Lab 01/25/23 1839 01/26/23 0410 01/27/23 0414 01/28/23 0417  NA 134* 136 137 139  K 3.4* 3.7 3.0* 3.6  CL 99 104 103 107  CO2 24 23 25 26   GLUCOSE 194* 163* 132* 91  BUN 16 16 15  27*  CREATININE 0.87 0.81 0.69 1.00  CALCIUM 8.4* 8.4* 8.9 8.8*  MG  --  1.8 1.9 2.1  PHOS  --  2.4* 2.7  --    Liver Function Tests: Recent Labs  Lab 01/25/23 1839 01/26/23 0410  AST 24 23  ALT 19 17  ALKPHOS 84 75  BILITOT 1.0 0.8  PROT 6.7 6.3*  ALBUMIN 2.8* 2.5*   CBG: Recent Labs  Lab 01/28/23 0516 01/28/23 1122 01/28/23 1705 01/28/23 2047 01/29/23 0516  GLUCAP 134* 187* 144* 131* 119*    Discharge time spent: greater than 30 minutes.  Signed: Tyrone Nine, MD [facility] 01/29/2023

## 2023-01-30 LAB — CULTURE, BLOOD (ROUTINE X 2)
Culture: NO GROWTH
Culture: NO GROWTH
Special Requests: ADEQUATE

## 2023-02-04 DIAGNOSIS — N390 Urinary tract infection, site not specified: Secondary | ICD-10-CM | POA: Diagnosis not present

## 2023-02-04 DIAGNOSIS — E876 Hypokalemia: Secondary | ICD-10-CM | POA: Diagnosis not present

## 2023-02-04 DIAGNOSIS — E785 Hyperlipidemia, unspecified: Secondary | ICD-10-CM | POA: Diagnosis not present

## 2023-02-04 DIAGNOSIS — G4700 Insomnia, unspecified: Secondary | ICD-10-CM | POA: Diagnosis not present

## 2023-02-04 DIAGNOSIS — F0390 Unspecified dementia without behavioral disturbance: Secondary | ICD-10-CM | POA: Diagnosis not present

## 2023-02-04 DIAGNOSIS — I10 Essential (primary) hypertension: Secondary | ICD-10-CM | POA: Diagnosis not present

## 2023-02-04 DIAGNOSIS — N400 Enlarged prostate without lower urinary tract symptoms: Secondary | ICD-10-CM | POA: Diagnosis not present

## 2023-02-06 NOTE — Consult Note (Addendum)
[facility] Customer service manager [facility]) Accountable Care Organization (ACO) [facility] Liaison Note  02/06/2023  Arash Heap 04-21-1946 [id number]  Location: [facility] Liaison screened the patient remotely at [facility].  Insurance: Valinda Hoar [facility] Medicare Advantage [employer] Medicare Advantage   Brad Shaw is a 77 y.o. male who is a Primary Care Patient of Joycelyn Rua, MD. The patient was screened for readmission hospitalization with noted medium risk score for unplanned readmission risk with 1 IP/1 ED in 6 months.  The patient was assessed for potential [facility]) Care Management service needs for post hospital transition for care coordination. Review of patient's electronic medical record reveals patient was admitted with Sepsis. Pt discharged to SNF level of care ([facility]). Facility to address pt's care management needs at this time.   [facility] Care Management/Population Health does not replace or interfere with any arrangements made by the Inpatient Transition of Care team.   For questions contact:   Elliot Cousin, RN, [facility] Liaison [place]   Population Health Office Hours MTWF  8:00 am-6:00 pm [phone] mobile [phone] [Office toll free line] Office Hours are M-F 8:30 - 5 pm [email or web address]

## 2023-02-13 DIAGNOSIS — G4700 Insomnia, unspecified: Secondary | ICD-10-CM | POA: Diagnosis not present

## 2023-02-13 DIAGNOSIS — F0390 Unspecified dementia without behavioral disturbance: Secondary | ICD-10-CM | POA: Diagnosis not present

## 2023-02-13 DIAGNOSIS — F411 Generalized anxiety disorder: Secondary | ICD-10-CM | POA: Diagnosis not present

## 2023-02-16 ENCOUNTER — Other Ambulatory Visit: Payer: Self-pay | Admitting: Internal Medicine

## 2023-02-17 DIAGNOSIS — F0390 Unspecified dementia without behavioral disturbance: Secondary | ICD-10-CM | POA: Diagnosis not present

## 2023-02-17 DIAGNOSIS — E440 Moderate protein-calorie malnutrition: Secondary | ICD-10-CM | POA: Diagnosis not present

## 2023-02-17 DIAGNOSIS — I10 Essential (primary) hypertension: Secondary | ICD-10-CM | POA: Diagnosis not present

## 2023-02-17 DIAGNOSIS — R32 Unspecified urinary incontinence: Secondary | ICD-10-CM | POA: Diagnosis not present

## 2023-02-17 DIAGNOSIS — S42201A Unspecified fracture of upper end of right humerus, initial encounter for closed fracture: Secondary | ICD-10-CM | POA: Diagnosis not present

## 2023-02-17 DIAGNOSIS — G4700 Insomnia, unspecified: Secondary | ICD-10-CM | POA: Diagnosis not present

## 2023-02-17 DIAGNOSIS — R600 Localized edema: Secondary | ICD-10-CM | POA: Diagnosis not present

## 2023-02-17 DIAGNOSIS — E785 Hyperlipidemia, unspecified: Secondary | ICD-10-CM | POA: Diagnosis not present

## 2023-02-18 DIAGNOSIS — M6281 Muscle weakness (generalized): Secondary | ICD-10-CM | POA: Diagnosis not present

## 2023-02-18 DIAGNOSIS — R262 Difficulty in walking, not elsewhere classified: Secondary | ICD-10-CM | POA: Diagnosis not present

## 2023-02-19 DIAGNOSIS — Z9181 History of falling: Secondary | ICD-10-CM | POA: Diagnosis not present

## 2023-02-19 DIAGNOSIS — I10 Essential (primary) hypertension: Secondary | ICD-10-CM | POA: Diagnosis not present

## 2023-02-19 DIAGNOSIS — E039 Hypothyroidism, unspecified: Secondary | ICD-10-CM | POA: Diagnosis not present

## 2023-02-19 DIAGNOSIS — E119 Type 2 diabetes mellitus without complications: Secondary | ICD-10-CM | POA: Diagnosis not present

## 2023-02-19 DIAGNOSIS — R2681 Unsteadiness on feet: Secondary | ICD-10-CM | POA: Diagnosis not present

## 2023-02-19 DIAGNOSIS — E785 Hyperlipidemia, unspecified: Secondary | ICD-10-CM | POA: Diagnosis not present

## 2023-02-19 DIAGNOSIS — E559 Vitamin D deficiency, unspecified: Secondary | ICD-10-CM | POA: Diagnosis not present

## 2023-02-19 DIAGNOSIS — M25512 Pain in left shoulder: Secondary | ICD-10-CM | POA: Diagnosis not present

## 2023-02-23 DIAGNOSIS — F0390 Unspecified dementia without behavioral disturbance: Secondary | ICD-10-CM | POA: Diagnosis not present

## 2023-02-23 DIAGNOSIS — M25512 Pain in left shoulder: Secondary | ICD-10-CM | POA: Diagnosis not present

## 2023-02-23 DIAGNOSIS — R2681 Unsteadiness on feet: Secondary | ICD-10-CM | POA: Diagnosis not present

## 2023-02-23 DIAGNOSIS — Z9181 History of falling: Secondary | ICD-10-CM | POA: Diagnosis not present

## 2023-02-23 DIAGNOSIS — F411 Generalized anxiety disorder: Secondary | ICD-10-CM | POA: Diagnosis not present

## 2023-02-24 DIAGNOSIS — M6281 Muscle weakness (generalized): Secondary | ICD-10-CM | POA: Diagnosis not present

## 2023-02-24 DIAGNOSIS — R262 Difficulty in walking, not elsewhere classified: Secondary | ICD-10-CM | POA: Diagnosis not present

## 2023-02-25 DIAGNOSIS — M25512 Pain in left shoulder: Secondary | ICD-10-CM | POA: Diagnosis not present

## 2023-02-25 DIAGNOSIS — Z9181 History of falling: Secondary | ICD-10-CM | POA: Diagnosis not present

## 2023-02-25 DIAGNOSIS — R2681 Unsteadiness on feet: Secondary | ICD-10-CM | POA: Diagnosis not present

## 2023-02-27 DIAGNOSIS — Z9181 History of falling: Secondary | ICD-10-CM | POA: Diagnosis not present

## 2023-02-27 DIAGNOSIS — M25512 Pain in left shoulder: Secondary | ICD-10-CM | POA: Diagnosis not present

## 2023-02-27 DIAGNOSIS — R2681 Unsteadiness on feet: Secondary | ICD-10-CM | POA: Diagnosis not present

## 2023-03-01 DIAGNOSIS — R262 Difficulty in walking, not elsewhere classified: Secondary | ICD-10-CM | POA: Diagnosis not present

## 2023-03-01 DIAGNOSIS — M6281 Muscle weakness (generalized): Secondary | ICD-10-CM | POA: Diagnosis not present

## 2023-03-02 DIAGNOSIS — Z9181 History of falling: Secondary | ICD-10-CM | POA: Diagnosis not present

## 2023-03-02 DIAGNOSIS — R2681 Unsteadiness on feet: Secondary | ICD-10-CM | POA: Diagnosis not present

## 2023-03-02 DIAGNOSIS — M25512 Pain in left shoulder: Secondary | ICD-10-CM | POA: Diagnosis not present

## 2023-03-03 DIAGNOSIS — R262 Difficulty in walking, not elsewhere classified: Secondary | ICD-10-CM | POA: Diagnosis not present

## 2023-03-03 DIAGNOSIS — M6281 Muscle weakness (generalized): Secondary | ICD-10-CM | POA: Diagnosis not present

## 2023-03-04 DIAGNOSIS — Z9181 History of falling: Secondary | ICD-10-CM | POA: Diagnosis not present

## 2023-03-04 DIAGNOSIS — R2681 Unsteadiness on feet: Secondary | ICD-10-CM | POA: Diagnosis not present

## 2023-03-04 DIAGNOSIS — M25512 Pain in left shoulder: Secondary | ICD-10-CM | POA: Diagnosis not present

## 2023-03-05 DIAGNOSIS — Z9181 History of falling: Secondary | ICD-10-CM | POA: Diagnosis not present

## 2023-03-05 DIAGNOSIS — M25512 Pain in left shoulder: Secondary | ICD-10-CM | POA: Diagnosis not present

## 2023-03-05 DIAGNOSIS — R2681 Unsteadiness on feet: Secondary | ICD-10-CM | POA: Diagnosis not present

## 2023-03-07 DIAGNOSIS — M6281 Muscle weakness (generalized): Secondary | ICD-10-CM | POA: Diagnosis not present

## 2023-03-07 DIAGNOSIS — R262 Difficulty in walking, not elsewhere classified: Secondary | ICD-10-CM | POA: Diagnosis not present

## 2023-03-08 DIAGNOSIS — R262 Difficulty in walking, not elsewhere classified: Secondary | ICD-10-CM | POA: Diagnosis not present

## 2023-03-08 DIAGNOSIS — M6281 Muscle weakness (generalized): Secondary | ICD-10-CM | POA: Diagnosis not present

## 2023-03-10 DIAGNOSIS — R2681 Unsteadiness on feet: Secondary | ICD-10-CM | POA: Diagnosis not present

## 2023-03-10 DIAGNOSIS — M25512 Pain in left shoulder: Secondary | ICD-10-CM | POA: Diagnosis not present

## 2023-03-10 DIAGNOSIS — Z9181 History of falling: Secondary | ICD-10-CM | POA: Diagnosis not present

## 2023-03-11 DIAGNOSIS — M25512 Pain in left shoulder: Secondary | ICD-10-CM | POA: Diagnosis not present

## 2023-03-11 DIAGNOSIS — R2681 Unsteadiness on feet: Secondary | ICD-10-CM | POA: Diagnosis not present

## 2023-03-11 DIAGNOSIS — Z9181 History of falling: Secondary | ICD-10-CM | POA: Diagnosis not present

## 2023-03-13 DIAGNOSIS — G4700 Insomnia, unspecified: Secondary | ICD-10-CM | POA: Diagnosis not present

## 2023-03-13 DIAGNOSIS — R2681 Unsteadiness on feet: Secondary | ICD-10-CM | POA: Diagnosis not present

## 2023-03-13 DIAGNOSIS — Z9181 History of falling: Secondary | ICD-10-CM | POA: Diagnosis not present

## 2023-03-13 DIAGNOSIS — M25512 Pain in left shoulder: Secondary | ICD-10-CM | POA: Diagnosis not present

## 2023-03-13 DIAGNOSIS — F331 Major depressive disorder, recurrent, moderate: Secondary | ICD-10-CM | POA: Diagnosis not present

## 2023-03-13 DIAGNOSIS — F0390 Unspecified dementia without behavioral disturbance: Secondary | ICD-10-CM | POA: Diagnosis not present

## 2023-03-15 DIAGNOSIS — R262 Difficulty in walking, not elsewhere classified: Secondary | ICD-10-CM | POA: Diagnosis not present

## 2023-03-15 DIAGNOSIS — M6281 Muscle weakness (generalized): Secondary | ICD-10-CM | POA: Diagnosis not present

## 2023-03-16 DIAGNOSIS — R4181 Age-related cognitive decline: Secondary | ICD-10-CM | POA: Diagnosis not present

## 2023-03-16 DIAGNOSIS — R2681 Unsteadiness on feet: Secondary | ICD-10-CM | POA: Diagnosis not present

## 2023-03-16 DIAGNOSIS — R41841 Cognitive communication deficit: Secondary | ICD-10-CM | POA: Diagnosis not present

## 2023-03-16 DIAGNOSIS — Z9181 History of falling: Secondary | ICD-10-CM | POA: Diagnosis not present

## 2023-03-16 DIAGNOSIS — M25512 Pain in left shoulder: Secondary | ICD-10-CM | POA: Diagnosis not present

## 2023-03-17 DIAGNOSIS — Z23 Encounter for immunization: Secondary | ICD-10-CM | POA: Diagnosis not present

## 2023-03-17 DIAGNOSIS — Z9181 History of falling: Secondary | ICD-10-CM | POA: Diagnosis not present

## 2023-03-17 DIAGNOSIS — M25512 Pain in left shoulder: Secondary | ICD-10-CM | POA: Diagnosis not present

## 2023-03-17 DIAGNOSIS — R2681 Unsteadiness on feet: Secondary | ICD-10-CM | POA: Diagnosis not present

## 2023-03-18 DIAGNOSIS — R4181 Age-related cognitive decline: Secondary | ICD-10-CM | POA: Diagnosis not present

## 2023-03-18 DIAGNOSIS — R2681 Unsteadiness on feet: Secondary | ICD-10-CM | POA: Diagnosis not present

## 2023-03-18 DIAGNOSIS — R41841 Cognitive communication deficit: Secondary | ICD-10-CM | POA: Diagnosis not present

## 2023-03-18 DIAGNOSIS — Z9181 History of falling: Secondary | ICD-10-CM | POA: Diagnosis not present

## 2023-03-18 DIAGNOSIS — M25512 Pain in left shoulder: Secondary | ICD-10-CM | POA: Diagnosis not present

## 2023-03-19 DIAGNOSIS — M6281 Muscle weakness (generalized): Secondary | ICD-10-CM | POA: Diagnosis not present

## 2023-03-19 DIAGNOSIS — R32 Unspecified urinary incontinence: Secondary | ICD-10-CM | POA: Diagnosis not present

## 2023-03-19 DIAGNOSIS — M25512 Pain in left shoulder: Secondary | ICD-10-CM | POA: Diagnosis not present

## 2023-03-19 DIAGNOSIS — R2681 Unsteadiness on feet: Secondary | ICD-10-CM | POA: Diagnosis not present

## 2023-03-19 DIAGNOSIS — I10 Essential (primary) hypertension: Secondary | ICD-10-CM | POA: Diagnosis not present

## 2023-03-19 DIAGNOSIS — Z9181 History of falling: Secondary | ICD-10-CM | POA: Diagnosis not present

## 2023-03-19 DIAGNOSIS — R7303 Prediabetes: Secondary | ICD-10-CM | POA: Diagnosis not present

## 2023-03-19 DIAGNOSIS — N390 Urinary tract infection, site not specified: Secondary | ICD-10-CM | POA: Diagnosis not present

## 2023-03-19 DIAGNOSIS — F0390 Unspecified dementia without behavioral disturbance: Secondary | ICD-10-CM | POA: Diagnosis not present

## 2023-03-19 DIAGNOSIS — R410 Disorientation, unspecified: Secondary | ICD-10-CM | POA: Diagnosis not present

## 2023-03-19 DIAGNOSIS — R262 Difficulty in walking, not elsewhere classified: Secondary | ICD-10-CM | POA: Diagnosis not present

## 2023-03-20 DIAGNOSIS — R4181 Age-related cognitive decline: Secondary | ICD-10-CM | POA: Diagnosis not present

## 2023-03-20 DIAGNOSIS — R41841 Cognitive communication deficit: Secondary | ICD-10-CM | POA: Diagnosis not present

## 2023-03-22 DIAGNOSIS — R262 Difficulty in walking, not elsewhere classified: Secondary | ICD-10-CM | POA: Diagnosis not present

## 2023-03-22 DIAGNOSIS — M25512 Pain in left shoulder: Secondary | ICD-10-CM | POA: Diagnosis not present

## 2023-03-22 DIAGNOSIS — Z9181 History of falling: Secondary | ICD-10-CM | POA: Diagnosis not present

## 2023-03-22 DIAGNOSIS — R2681 Unsteadiness on feet: Secondary | ICD-10-CM | POA: Diagnosis not present

## 2023-03-22 DIAGNOSIS — M6281 Muscle weakness (generalized): Secondary | ICD-10-CM | POA: Diagnosis not present

## 2023-03-23 DIAGNOSIS — M25512 Pain in left shoulder: Secondary | ICD-10-CM | POA: Diagnosis not present

## 2023-03-23 DIAGNOSIS — R2681 Unsteadiness on feet: Secondary | ICD-10-CM | POA: Diagnosis not present

## 2023-03-23 DIAGNOSIS — Z9181 History of falling: Secondary | ICD-10-CM | POA: Diagnosis not present

## 2023-03-23 DIAGNOSIS — R41841 Cognitive communication deficit: Secondary | ICD-10-CM | POA: Diagnosis not present

## 2023-03-23 DIAGNOSIS — R4181 Age-related cognitive decline: Secondary | ICD-10-CM | POA: Diagnosis not present

## 2023-03-24 DIAGNOSIS — Z9181 History of falling: Secondary | ICD-10-CM | POA: Diagnosis not present

## 2023-03-24 DIAGNOSIS — R2681 Unsteadiness on feet: Secondary | ICD-10-CM | POA: Diagnosis not present

## 2023-03-24 DIAGNOSIS — M25512 Pain in left shoulder: Secondary | ICD-10-CM | POA: Diagnosis not present

## 2023-03-25 DIAGNOSIS — R2681 Unsteadiness on feet: Secondary | ICD-10-CM | POA: Diagnosis not present

## 2023-03-25 DIAGNOSIS — R41841 Cognitive communication deficit: Secondary | ICD-10-CM | POA: Diagnosis not present

## 2023-03-25 DIAGNOSIS — I10 Essential (primary) hypertension: Secondary | ICD-10-CM | POA: Diagnosis not present

## 2023-03-25 DIAGNOSIS — M25512 Pain in left shoulder: Secondary | ICD-10-CM | POA: Diagnosis not present

## 2023-03-25 DIAGNOSIS — Z9181 History of falling: Secondary | ICD-10-CM | POA: Diagnosis not present

## 2023-03-25 DIAGNOSIS — F0390 Unspecified dementia without behavioral disturbance: Secondary | ICD-10-CM | POA: Diagnosis not present

## 2023-03-25 DIAGNOSIS — R4181 Age-related cognitive decline: Secondary | ICD-10-CM | POA: Diagnosis not present

## 2023-03-25 DIAGNOSIS — R7303 Prediabetes: Secondary | ICD-10-CM | POA: Diagnosis not present

## 2023-03-29 DIAGNOSIS — Z9181 History of falling: Secondary | ICD-10-CM | POA: Diagnosis not present

## 2023-03-29 DIAGNOSIS — M25512 Pain in left shoulder: Secondary | ICD-10-CM | POA: Diagnosis not present

## 2023-03-29 DIAGNOSIS — R2681 Unsteadiness on feet: Secondary | ICD-10-CM | POA: Diagnosis not present

## 2023-03-29 DIAGNOSIS — M6281 Muscle weakness (generalized): Secondary | ICD-10-CM | POA: Diagnosis not present

## 2023-03-29 DIAGNOSIS — R262 Difficulty in walking, not elsewhere classified: Secondary | ICD-10-CM | POA: Diagnosis not present

## 2023-03-30 DIAGNOSIS — M25512 Pain in left shoulder: Secondary | ICD-10-CM | POA: Diagnosis not present

## 2023-03-30 DIAGNOSIS — R4181 Age-related cognitive decline: Secondary | ICD-10-CM | POA: Diagnosis not present

## 2023-03-30 DIAGNOSIS — Z9181 History of falling: Secondary | ICD-10-CM | POA: Diagnosis not present

## 2023-03-30 DIAGNOSIS — R41841 Cognitive communication deficit: Secondary | ICD-10-CM | POA: Diagnosis not present

## 2023-03-30 DIAGNOSIS — R2681 Unsteadiness on feet: Secondary | ICD-10-CM | POA: Diagnosis not present

## 2023-03-31 DIAGNOSIS — Z9181 History of falling: Secondary | ICD-10-CM | POA: Diagnosis not present

## 2023-03-31 DIAGNOSIS — R2681 Unsteadiness on feet: Secondary | ICD-10-CM | POA: Diagnosis not present

## 2023-03-31 DIAGNOSIS — M25512 Pain in left shoulder: Secondary | ICD-10-CM | POA: Diagnosis not present

## 2023-04-01 DIAGNOSIS — M6281 Muscle weakness (generalized): Secondary | ICD-10-CM | POA: Diagnosis not present

## 2023-04-01 DIAGNOSIS — R4181 Age-related cognitive decline: Secondary | ICD-10-CM | POA: Diagnosis not present

## 2023-04-01 DIAGNOSIS — M25512 Pain in left shoulder: Secondary | ICD-10-CM | POA: Diagnosis not present

## 2023-04-01 DIAGNOSIS — G309 Alzheimer's disease, unspecified: Secondary | ICD-10-CM | POA: Diagnosis not present

## 2023-04-01 DIAGNOSIS — R262 Difficulty in walking, not elsewhere classified: Secondary | ICD-10-CM | POA: Diagnosis not present

## 2023-04-01 DIAGNOSIS — N401 Enlarged prostate with lower urinary tract symptoms: Secondary | ICD-10-CM | POA: Diagnosis not present

## 2023-04-01 DIAGNOSIS — Z9181 History of falling: Secondary | ICD-10-CM | POA: Diagnosis not present

## 2023-04-01 DIAGNOSIS — R41841 Cognitive communication deficit: Secondary | ICD-10-CM | POA: Diagnosis not present

## 2023-04-01 DIAGNOSIS — R2681 Unsteadiness on feet: Secondary | ICD-10-CM | POA: Diagnosis not present

## 2023-04-02 DIAGNOSIS — M6281 Muscle weakness (generalized): Secondary | ICD-10-CM | POA: Diagnosis not present

## 2023-04-02 DIAGNOSIS — M25512 Pain in left shoulder: Secondary | ICD-10-CM | POA: Diagnosis not present

## 2023-04-02 DIAGNOSIS — R2681 Unsteadiness on feet: Secondary | ICD-10-CM | POA: Diagnosis not present

## 2023-04-02 DIAGNOSIS — Z9181 History of falling: Secondary | ICD-10-CM | POA: Diagnosis not present

## 2023-04-02 DIAGNOSIS — R262 Difficulty in walking, not elsewhere classified: Secondary | ICD-10-CM | POA: Diagnosis not present

## 2023-04-03 DIAGNOSIS — M25512 Pain in left shoulder: Secondary | ICD-10-CM | POA: Diagnosis not present

## 2023-04-03 DIAGNOSIS — R2681 Unsteadiness on feet: Secondary | ICD-10-CM | POA: Diagnosis not present

## 2023-04-03 DIAGNOSIS — Z9181 History of falling: Secondary | ICD-10-CM | POA: Diagnosis not present

## 2023-04-05 DIAGNOSIS — M25512 Pain in left shoulder: Secondary | ICD-10-CM | POA: Diagnosis not present

## 2023-04-05 DIAGNOSIS — Z9181 History of falling: Secondary | ICD-10-CM | POA: Diagnosis not present

## 2023-04-05 DIAGNOSIS — R2681 Unsteadiness on feet: Secondary | ICD-10-CM | POA: Diagnosis not present

## 2023-04-06 DIAGNOSIS — R4181 Age-related cognitive decline: Secondary | ICD-10-CM | POA: Diagnosis not present

## 2023-04-06 DIAGNOSIS — R2681 Unsteadiness on feet: Secondary | ICD-10-CM | POA: Diagnosis not present

## 2023-04-06 DIAGNOSIS — R41841 Cognitive communication deficit: Secondary | ICD-10-CM | POA: Diagnosis not present

## 2023-04-06 DIAGNOSIS — Z9181 History of falling: Secondary | ICD-10-CM | POA: Diagnosis not present

## 2023-04-06 DIAGNOSIS — M25512 Pain in left shoulder: Secondary | ICD-10-CM | POA: Diagnosis not present

## 2023-04-07 DIAGNOSIS — B351 Tinea unguium: Secondary | ICD-10-CM | POA: Diagnosis not present

## 2023-04-07 DIAGNOSIS — M2041 Other hammer toe(s) (acquired), right foot: Secondary | ICD-10-CM | POA: Diagnosis not present

## 2023-04-10 DIAGNOSIS — G4700 Insomnia, unspecified: Secondary | ICD-10-CM | POA: Diagnosis not present

## 2023-04-10 DIAGNOSIS — R4181 Age-related cognitive decline: Secondary | ICD-10-CM | POA: Diagnosis not present

## 2023-04-10 DIAGNOSIS — Z9181 History of falling: Secondary | ICD-10-CM | POA: Diagnosis not present

## 2023-04-10 DIAGNOSIS — M25512 Pain in left shoulder: Secondary | ICD-10-CM | POA: Diagnosis not present

## 2023-04-10 DIAGNOSIS — R2681 Unsteadiness on feet: Secondary | ICD-10-CM | POA: Diagnosis not present

## 2023-04-10 DIAGNOSIS — R41841 Cognitive communication deficit: Secondary | ICD-10-CM | POA: Diagnosis not present

## 2023-04-10 DIAGNOSIS — G309 Alzheimer's disease, unspecified: Secondary | ICD-10-CM | POA: Diagnosis not present

## 2023-04-10 DIAGNOSIS — F02B3 Dementia in other diseases classified elsewhere, moderate, with mood disturbance: Secondary | ICD-10-CM | POA: Diagnosis not present

## 2023-04-12 DIAGNOSIS — Z9181 History of falling: Secondary | ICD-10-CM | POA: Diagnosis not present

## 2023-04-12 DIAGNOSIS — R2681 Unsteadiness on feet: Secondary | ICD-10-CM | POA: Diagnosis not present

## 2023-04-12 DIAGNOSIS — M6281 Muscle weakness (generalized): Secondary | ICD-10-CM | POA: Diagnosis not present

## 2023-04-12 DIAGNOSIS — M25512 Pain in left shoulder: Secondary | ICD-10-CM | POA: Diagnosis not present

## 2023-04-12 DIAGNOSIS — R262 Difficulty in walking, not elsewhere classified: Secondary | ICD-10-CM | POA: Diagnosis not present

## 2023-04-13 DIAGNOSIS — R41841 Cognitive communication deficit: Secondary | ICD-10-CM | POA: Diagnosis not present

## 2023-04-13 DIAGNOSIS — R4181 Age-related cognitive decline: Secondary | ICD-10-CM | POA: Diagnosis not present

## 2023-04-14 DIAGNOSIS — M25512 Pain in left shoulder: Secondary | ICD-10-CM | POA: Diagnosis not present

## 2023-04-14 DIAGNOSIS — Z9181 History of falling: Secondary | ICD-10-CM | POA: Diagnosis not present

## 2023-04-14 DIAGNOSIS — N401 Enlarged prostate with lower urinary tract symptoms: Secondary | ICD-10-CM | POA: Diagnosis not present

## 2023-04-14 DIAGNOSIS — N390 Urinary tract infection, site not specified: Secondary | ICD-10-CM | POA: Diagnosis not present

## 2023-04-14 DIAGNOSIS — R2681 Unsteadiness on feet: Secondary | ICD-10-CM | POA: Diagnosis not present

## 2023-04-15 DIAGNOSIS — R2681 Unsteadiness on feet: Secondary | ICD-10-CM | POA: Diagnosis not present

## 2023-04-15 DIAGNOSIS — Z9181 History of falling: Secondary | ICD-10-CM | POA: Diagnosis not present

## 2023-04-15 DIAGNOSIS — M25512 Pain in left shoulder: Secondary | ICD-10-CM | POA: Diagnosis not present

## 2023-04-16 DIAGNOSIS — E559 Vitamin D deficiency, unspecified: Secondary | ICD-10-CM | POA: Diagnosis not present

## 2023-04-16 DIAGNOSIS — M6281 Muscle weakness (generalized): Secondary | ICD-10-CM | POA: Diagnosis not present

## 2023-04-16 DIAGNOSIS — N401 Enlarged prostate with lower urinary tract symptoms: Secondary | ICD-10-CM | POA: Diagnosis not present

## 2023-04-16 DIAGNOSIS — E785 Hyperlipidemia, unspecified: Secondary | ICD-10-CM | POA: Diagnosis not present

## 2023-04-16 DIAGNOSIS — R262 Difficulty in walking, not elsewhere classified: Secondary | ICD-10-CM | POA: Diagnosis not present

## 2023-04-16 DIAGNOSIS — R300 Dysuria: Secondary | ICD-10-CM | POA: Diagnosis not present

## 2023-04-16 DIAGNOSIS — G4700 Insomnia, unspecified: Secondary | ICD-10-CM | POA: Diagnosis not present

## 2023-04-17 DIAGNOSIS — Z9181 History of falling: Secondary | ICD-10-CM | POA: Diagnosis not present

## 2023-04-17 DIAGNOSIS — R2681 Unsteadiness on feet: Secondary | ICD-10-CM | POA: Diagnosis not present

## 2023-04-17 DIAGNOSIS — M25512 Pain in left shoulder: Secondary | ICD-10-CM | POA: Diagnosis not present

## 2023-04-17 DIAGNOSIS — R41841 Cognitive communication deficit: Secondary | ICD-10-CM | POA: Diagnosis not present

## 2023-04-17 DIAGNOSIS — R4181 Age-related cognitive decline: Secondary | ICD-10-CM | POA: Diagnosis not present

## 2023-04-19 DIAGNOSIS — M25512 Pain in left shoulder: Secondary | ICD-10-CM | POA: Diagnosis not present

## 2023-04-19 DIAGNOSIS — R2681 Unsteadiness on feet: Secondary | ICD-10-CM | POA: Diagnosis not present

## 2023-04-19 DIAGNOSIS — M6281 Muscle weakness (generalized): Secondary | ICD-10-CM | POA: Diagnosis not present

## 2023-04-19 DIAGNOSIS — Z9181 History of falling: Secondary | ICD-10-CM | POA: Diagnosis not present

## 2023-04-19 DIAGNOSIS — R262 Difficulty in walking, not elsewhere classified: Secondary | ICD-10-CM | POA: Diagnosis not present

## 2023-04-20 DIAGNOSIS — R2681 Unsteadiness on feet: Secondary | ICD-10-CM | POA: Diagnosis not present

## 2023-04-20 DIAGNOSIS — R41841 Cognitive communication deficit: Secondary | ICD-10-CM | POA: Diagnosis not present

## 2023-04-20 DIAGNOSIS — Z9181 History of falling: Secondary | ICD-10-CM | POA: Diagnosis not present

## 2023-04-20 DIAGNOSIS — M25512 Pain in left shoulder: Secondary | ICD-10-CM | POA: Diagnosis not present

## 2023-04-20 DIAGNOSIS — R4181 Age-related cognitive decline: Secondary | ICD-10-CM | POA: Diagnosis not present

## 2023-04-21 DIAGNOSIS — G309 Alzheimer's disease, unspecified: Secondary | ICD-10-CM | POA: Diagnosis not present

## 2023-04-21 DIAGNOSIS — N401 Enlarged prostate with lower urinary tract symptoms: Secondary | ICD-10-CM | POA: Diagnosis not present

## 2023-04-21 DIAGNOSIS — M6281 Muscle weakness (generalized): Secondary | ICD-10-CM | POA: Diagnosis not present

## 2023-04-21 DIAGNOSIS — R262 Difficulty in walking, not elsewhere classified: Secondary | ICD-10-CM | POA: Diagnosis not present

## 2023-04-22 DIAGNOSIS — R4181 Age-related cognitive decline: Secondary | ICD-10-CM | POA: Diagnosis not present

## 2023-04-22 DIAGNOSIS — R41841 Cognitive communication deficit: Secondary | ICD-10-CM | POA: Diagnosis not present

## 2023-04-22 DIAGNOSIS — M25512 Pain in left shoulder: Secondary | ICD-10-CM | POA: Diagnosis not present

## 2023-04-22 DIAGNOSIS — Z9181 History of falling: Secondary | ICD-10-CM | POA: Diagnosis not present

## 2023-04-22 DIAGNOSIS — R2681 Unsteadiness on feet: Secondary | ICD-10-CM | POA: Diagnosis not present

## 2023-04-24 DIAGNOSIS — M25512 Pain in left shoulder: Secondary | ICD-10-CM | POA: Diagnosis not present

## 2023-04-24 DIAGNOSIS — Z9181 History of falling: Secondary | ICD-10-CM | POA: Diagnosis not present

## 2023-04-24 DIAGNOSIS — R2681 Unsteadiness on feet: Secondary | ICD-10-CM | POA: Diagnosis not present

## 2023-04-26 DIAGNOSIS — Z9181 History of falling: Secondary | ICD-10-CM | POA: Diagnosis not present

## 2023-04-26 DIAGNOSIS — M6281 Muscle weakness (generalized): Secondary | ICD-10-CM | POA: Diagnosis not present

## 2023-04-26 DIAGNOSIS — R2681 Unsteadiness on feet: Secondary | ICD-10-CM | POA: Diagnosis not present

## 2023-04-26 DIAGNOSIS — R262 Difficulty in walking, not elsewhere classified: Secondary | ICD-10-CM | POA: Diagnosis not present

## 2023-04-26 DIAGNOSIS — M25512 Pain in left shoulder: Secondary | ICD-10-CM | POA: Diagnosis not present

## 2023-04-27 DIAGNOSIS — Z9181 History of falling: Secondary | ICD-10-CM | POA: Diagnosis not present

## 2023-04-27 DIAGNOSIS — R2681 Unsteadiness on feet: Secondary | ICD-10-CM | POA: Diagnosis not present

## 2023-04-27 DIAGNOSIS — M25512 Pain in left shoulder: Secondary | ICD-10-CM | POA: Diagnosis not present

## 2023-04-27 DIAGNOSIS — Z23 Encounter for immunization: Secondary | ICD-10-CM | POA: Diagnosis not present

## 2023-04-28 DIAGNOSIS — R4181 Age-related cognitive decline: Secondary | ICD-10-CM | POA: Diagnosis not present

## 2023-04-28 DIAGNOSIS — N390 Urinary tract infection, site not specified: Secondary | ICD-10-CM | POA: Diagnosis not present

## 2023-04-28 DIAGNOSIS — R41841 Cognitive communication deficit: Secondary | ICD-10-CM | POA: Diagnosis not present

## 2023-04-28 DIAGNOSIS — R262 Difficulty in walking, not elsewhere classified: Secondary | ICD-10-CM | POA: Diagnosis not present

## 2023-04-28 DIAGNOSIS — M6281 Muscle weakness (generalized): Secondary | ICD-10-CM | POA: Diagnosis not present

## 2023-04-29 DIAGNOSIS — R2681 Unsteadiness on feet: Secondary | ICD-10-CM | POA: Diagnosis not present

## 2023-04-29 DIAGNOSIS — R4181 Age-related cognitive decline: Secondary | ICD-10-CM | POA: Diagnosis not present

## 2023-04-29 DIAGNOSIS — R41841 Cognitive communication deficit: Secondary | ICD-10-CM | POA: Diagnosis not present

## 2023-04-29 DIAGNOSIS — M25512 Pain in left shoulder: Secondary | ICD-10-CM | POA: Diagnosis not present

## 2023-04-29 DIAGNOSIS — Z9181 History of falling: Secondary | ICD-10-CM | POA: Diagnosis not present

## 2023-04-30 DIAGNOSIS — M25512 Pain in left shoulder: Secondary | ICD-10-CM | POA: Diagnosis not present

## 2023-04-30 DIAGNOSIS — Z9181 History of falling: Secondary | ICD-10-CM | POA: Diagnosis not present

## 2023-04-30 DIAGNOSIS — R2681 Unsteadiness on feet: Secondary | ICD-10-CM | POA: Diagnosis not present

## 2023-05-03 DIAGNOSIS — M25512 Pain in left shoulder: Secondary | ICD-10-CM | POA: Diagnosis not present

## 2023-05-03 DIAGNOSIS — R2681 Unsteadiness on feet: Secondary | ICD-10-CM | POA: Diagnosis not present

## 2023-05-03 DIAGNOSIS — Z9181 History of falling: Secondary | ICD-10-CM | POA: Diagnosis not present

## 2023-05-04 DIAGNOSIS — Z9181 History of falling: Secondary | ICD-10-CM | POA: Diagnosis not present

## 2023-05-04 DIAGNOSIS — R2681 Unsteadiness on feet: Secondary | ICD-10-CM | POA: Diagnosis not present

## 2023-05-04 DIAGNOSIS — M25512 Pain in left shoulder: Secondary | ICD-10-CM | POA: Diagnosis not present

## 2023-05-04 DIAGNOSIS — R41841 Cognitive communication deficit: Secondary | ICD-10-CM | POA: Diagnosis not present

## 2023-05-04 DIAGNOSIS — R4181 Age-related cognitive decline: Secondary | ICD-10-CM | POA: Diagnosis not present

## 2023-05-05 DIAGNOSIS — Z9181 History of falling: Secondary | ICD-10-CM | POA: Diagnosis not present

## 2023-05-05 DIAGNOSIS — M25512 Pain in left shoulder: Secondary | ICD-10-CM | POA: Diagnosis not present

## 2023-05-05 DIAGNOSIS — R2681 Unsteadiness on feet: Secondary | ICD-10-CM | POA: Diagnosis not present

## 2023-05-06 DIAGNOSIS — R2681 Unsteadiness on feet: Secondary | ICD-10-CM | POA: Diagnosis not present

## 2023-05-06 DIAGNOSIS — R41841 Cognitive communication deficit: Secondary | ICD-10-CM | POA: Diagnosis not present

## 2023-05-06 DIAGNOSIS — R4181 Age-related cognitive decline: Secondary | ICD-10-CM | POA: Diagnosis not present

## 2023-05-06 DIAGNOSIS — Z9181 History of falling: Secondary | ICD-10-CM | POA: Diagnosis not present

## 2023-05-06 DIAGNOSIS — M25512 Pain in left shoulder: Secondary | ICD-10-CM | POA: Diagnosis not present

## 2023-05-07 DIAGNOSIS — R2681 Unsteadiness on feet: Secondary | ICD-10-CM | POA: Diagnosis not present

## 2023-05-07 DIAGNOSIS — M25512 Pain in left shoulder: Secondary | ICD-10-CM | POA: Diagnosis not present

## 2023-05-07 DIAGNOSIS — Z9181 History of falling: Secondary | ICD-10-CM | POA: Diagnosis not present

## 2023-05-07 DIAGNOSIS — G309 Alzheimer's disease, unspecified: Secondary | ICD-10-CM | POA: Diagnosis not present

## 2023-05-07 DIAGNOSIS — R262 Difficulty in walking, not elsewhere classified: Secondary | ICD-10-CM | POA: Diagnosis not present

## 2023-05-07 DIAGNOSIS — F0280 Dementia in other diseases classified elsewhere without behavioral disturbance: Secondary | ICD-10-CM | POA: Diagnosis not present

## 2023-05-07 DIAGNOSIS — M6281 Muscle weakness (generalized): Secondary | ICD-10-CM | POA: Diagnosis not present

## 2023-05-08 DIAGNOSIS — Z9181 History of falling: Secondary | ICD-10-CM | POA: Diagnosis not present

## 2023-05-08 DIAGNOSIS — G309 Alzheimer's disease, unspecified: Secondary | ICD-10-CM | POA: Diagnosis not present

## 2023-05-08 DIAGNOSIS — F02B3 Dementia in other diseases classified elsewhere, moderate, with mood disturbance: Secondary | ICD-10-CM | POA: Diagnosis not present

## 2023-05-08 DIAGNOSIS — R4181 Age-related cognitive decline: Secondary | ICD-10-CM | POA: Diagnosis not present

## 2023-05-08 DIAGNOSIS — G4700 Insomnia, unspecified: Secondary | ICD-10-CM | POA: Diagnosis not present

## 2023-05-08 DIAGNOSIS — R2681 Unsteadiness on feet: Secondary | ICD-10-CM | POA: Diagnosis not present

## 2023-05-08 DIAGNOSIS — M25512 Pain in left shoulder: Secondary | ICD-10-CM | POA: Diagnosis not present

## 2023-05-08 DIAGNOSIS — R41841 Cognitive communication deficit: Secondary | ICD-10-CM | POA: Diagnosis not present

## 2023-05-10 DIAGNOSIS — M25512 Pain in left shoulder: Secondary | ICD-10-CM | POA: Diagnosis not present

## 2023-05-10 DIAGNOSIS — Z9181 History of falling: Secondary | ICD-10-CM | POA: Diagnosis not present

## 2023-05-10 DIAGNOSIS — R2681 Unsteadiness on feet: Secondary | ICD-10-CM | POA: Diagnosis not present

## 2023-05-11 DIAGNOSIS — R4181 Age-related cognitive decline: Secondary | ICD-10-CM | POA: Diagnosis not present

## 2023-05-11 DIAGNOSIS — R41841 Cognitive communication deficit: Secondary | ICD-10-CM | POA: Diagnosis not present

## 2023-05-11 DIAGNOSIS — M25512 Pain in left shoulder: Secondary | ICD-10-CM | POA: Diagnosis not present

## 2023-05-11 DIAGNOSIS — Z9181 History of falling: Secondary | ICD-10-CM | POA: Diagnosis not present

## 2023-05-11 DIAGNOSIS — R2681 Unsteadiness on feet: Secondary | ICD-10-CM | POA: Diagnosis not present

## 2023-05-13 DIAGNOSIS — Z9181 History of falling: Secondary | ICD-10-CM | POA: Diagnosis not present

## 2023-05-13 DIAGNOSIS — R4181 Age-related cognitive decline: Secondary | ICD-10-CM | POA: Diagnosis not present

## 2023-05-13 DIAGNOSIS — R41841 Cognitive communication deficit: Secondary | ICD-10-CM | POA: Diagnosis not present

## 2023-05-13 DIAGNOSIS — R2681 Unsteadiness on feet: Secondary | ICD-10-CM | POA: Diagnosis not present

## 2023-05-13 DIAGNOSIS — M25512 Pain in left shoulder: Secondary | ICD-10-CM | POA: Diagnosis not present

## 2023-05-14 DIAGNOSIS — E440 Moderate protein-calorie malnutrition: Secondary | ICD-10-CM | POA: Diagnosis not present

## 2023-05-14 DIAGNOSIS — I10 Essential (primary) hypertension: Secondary | ICD-10-CM | POA: Diagnosis not present

## 2023-05-14 DIAGNOSIS — F02B3 Dementia in other diseases classified elsewhere, moderate, with mood disturbance: Secondary | ICD-10-CM | POA: Diagnosis not present

## 2023-05-14 DIAGNOSIS — G309 Alzheimer's disease, unspecified: Secondary | ICD-10-CM | POA: Diagnosis not present

## 2023-05-15 DIAGNOSIS — R41841 Cognitive communication deficit: Secondary | ICD-10-CM | POA: Diagnosis not present

## 2023-05-15 DIAGNOSIS — R4181 Age-related cognitive decline: Secondary | ICD-10-CM | POA: Diagnosis not present

## 2023-05-16 DIAGNOSIS — Z9181 History of falling: Secondary | ICD-10-CM | POA: Diagnosis not present

## 2023-05-16 DIAGNOSIS — R2681 Unsteadiness on feet: Secondary | ICD-10-CM | POA: Diagnosis not present

## 2023-05-16 DIAGNOSIS — M25512 Pain in left shoulder: Secondary | ICD-10-CM | POA: Diagnosis not present

## 2023-05-17 DIAGNOSIS — M6281 Muscle weakness (generalized): Secondary | ICD-10-CM | POA: Diagnosis not present

## 2023-05-17 DIAGNOSIS — R2681 Unsteadiness on feet: Secondary | ICD-10-CM | POA: Diagnosis not present

## 2023-05-17 DIAGNOSIS — R262 Difficulty in walking, not elsewhere classified: Secondary | ICD-10-CM | POA: Diagnosis not present

## 2023-05-17 DIAGNOSIS — Z9181 History of falling: Secondary | ICD-10-CM | POA: Diagnosis not present

## 2023-05-17 DIAGNOSIS — M25512 Pain in left shoulder: Secondary | ICD-10-CM | POA: Diagnosis not present

## 2023-05-18 DIAGNOSIS — N401 Enlarged prostate with lower urinary tract symptoms: Secondary | ICD-10-CM | POA: Diagnosis not present

## 2023-05-18 DIAGNOSIS — M25512 Pain in left shoulder: Secondary | ICD-10-CM | POA: Diagnosis not present

## 2023-05-18 DIAGNOSIS — R2681 Unsteadiness on feet: Secondary | ICD-10-CM | POA: Diagnosis not present

## 2023-05-18 DIAGNOSIS — G309 Alzheimer's disease, unspecified: Secondary | ICD-10-CM | POA: Diagnosis not present

## 2023-05-18 DIAGNOSIS — Z9181 History of falling: Secondary | ICD-10-CM | POA: Diagnosis not present

## 2023-05-20 DIAGNOSIS — Z9181 History of falling: Secondary | ICD-10-CM | POA: Diagnosis not present

## 2023-05-20 DIAGNOSIS — R41841 Cognitive communication deficit: Secondary | ICD-10-CM | POA: Diagnosis not present

## 2023-05-20 DIAGNOSIS — M25512 Pain in left shoulder: Secondary | ICD-10-CM | POA: Diagnosis not present

## 2023-05-20 DIAGNOSIS — R4181 Age-related cognitive decline: Secondary | ICD-10-CM | POA: Diagnosis not present

## 2023-05-20 DIAGNOSIS — R2681 Unsteadiness on feet: Secondary | ICD-10-CM | POA: Diagnosis not present

## 2023-05-21 DIAGNOSIS — R2681 Unsteadiness on feet: Secondary | ICD-10-CM | POA: Diagnosis not present

## 2023-05-21 DIAGNOSIS — Z9181 History of falling: Secondary | ICD-10-CM | POA: Diagnosis not present

## 2023-05-21 DIAGNOSIS — M25512 Pain in left shoulder: Secondary | ICD-10-CM | POA: Diagnosis not present

## 2023-05-22 DIAGNOSIS — M25512 Pain in left shoulder: Secondary | ICD-10-CM | POA: Diagnosis not present

## 2023-05-22 DIAGNOSIS — R262 Difficulty in walking, not elsewhere classified: Secondary | ICD-10-CM | POA: Diagnosis not present

## 2023-05-22 DIAGNOSIS — R2681 Unsteadiness on feet: Secondary | ICD-10-CM | POA: Diagnosis not present

## 2023-05-22 DIAGNOSIS — Z9181 History of falling: Secondary | ICD-10-CM | POA: Diagnosis not present

## 2023-05-22 DIAGNOSIS — M6281 Muscle weakness (generalized): Secondary | ICD-10-CM | POA: Diagnosis not present

## 2023-05-22 DIAGNOSIS — R4181 Age-related cognitive decline: Secondary | ICD-10-CM | POA: Diagnosis not present

## 2023-05-22 DIAGNOSIS — R41841 Cognitive communication deficit: Secondary | ICD-10-CM | POA: Diagnosis not present

## 2023-05-24 DIAGNOSIS — M25512 Pain in left shoulder: Secondary | ICD-10-CM | POA: Diagnosis not present

## 2023-05-24 DIAGNOSIS — Z9181 History of falling: Secondary | ICD-10-CM | POA: Diagnosis not present

## 2023-05-24 DIAGNOSIS — M6281 Muscle weakness (generalized): Secondary | ICD-10-CM | POA: Diagnosis not present

## 2023-05-24 DIAGNOSIS — R262 Difficulty in walking, not elsewhere classified: Secondary | ICD-10-CM | POA: Diagnosis not present

## 2023-05-24 DIAGNOSIS — R2681 Unsteadiness on feet: Secondary | ICD-10-CM | POA: Diagnosis not present

## 2023-05-25 DIAGNOSIS — Z9181 History of falling: Secondary | ICD-10-CM | POA: Diagnosis not present

## 2023-05-25 DIAGNOSIS — R41841 Cognitive communication deficit: Secondary | ICD-10-CM | POA: Diagnosis not present

## 2023-05-25 DIAGNOSIS — R4181 Age-related cognitive decline: Secondary | ICD-10-CM | POA: Diagnosis not present

## 2023-05-25 DIAGNOSIS — M25512 Pain in left shoulder: Secondary | ICD-10-CM | POA: Diagnosis not present

## 2023-05-25 DIAGNOSIS — R2681 Unsteadiness on feet: Secondary | ICD-10-CM | POA: Diagnosis not present

## 2023-05-26 DIAGNOSIS — Z9181 History of falling: Secondary | ICD-10-CM | POA: Diagnosis not present

## 2023-05-26 DIAGNOSIS — M25512 Pain in left shoulder: Secondary | ICD-10-CM | POA: Diagnosis not present

## 2023-05-26 DIAGNOSIS — R2681 Unsteadiness on feet: Secondary | ICD-10-CM | POA: Diagnosis not present

## 2023-05-28 DIAGNOSIS — M25512 Pain in left shoulder: Secondary | ICD-10-CM | POA: Diagnosis not present

## 2023-05-28 DIAGNOSIS — R2681 Unsteadiness on feet: Secondary | ICD-10-CM | POA: Diagnosis not present

## 2023-05-28 DIAGNOSIS — Z9181 History of falling: Secondary | ICD-10-CM | POA: Diagnosis not present

## 2023-05-29 DIAGNOSIS — R41841 Cognitive communication deficit: Secondary | ICD-10-CM | POA: Diagnosis not present

## 2023-05-29 DIAGNOSIS — R4181 Age-related cognitive decline: Secondary | ICD-10-CM | POA: Diagnosis not present

## 2023-05-30 DIAGNOSIS — R2681 Unsteadiness on feet: Secondary | ICD-10-CM | POA: Diagnosis not present

## 2023-05-30 DIAGNOSIS — Z9181 History of falling: Secondary | ICD-10-CM | POA: Diagnosis not present

## 2023-05-30 DIAGNOSIS — M25512 Pain in left shoulder: Secondary | ICD-10-CM | POA: Diagnosis not present

## 2023-06-01 DIAGNOSIS — R4181 Age-related cognitive decline: Secondary | ICD-10-CM | POA: Diagnosis not present

## 2023-06-01 DIAGNOSIS — C61 Malignant neoplasm of prostate: Secondary | ICD-10-CM | POA: Diagnosis not present

## 2023-06-01 DIAGNOSIS — R41841 Cognitive communication deficit: Secondary | ICD-10-CM | POA: Diagnosis not present

## 2023-06-01 DIAGNOSIS — Z9181 History of falling: Secondary | ICD-10-CM | POA: Diagnosis not present

## 2023-06-01 DIAGNOSIS — M25512 Pain in left shoulder: Secondary | ICD-10-CM | POA: Diagnosis not present

## 2023-06-01 DIAGNOSIS — R2681 Unsteadiness on feet: Secondary | ICD-10-CM | POA: Diagnosis not present

## 2023-06-01 DIAGNOSIS — Z131 Encounter for screening for diabetes mellitus: Secondary | ICD-10-CM | POA: Diagnosis not present

## 2023-06-03 DIAGNOSIS — M25512 Pain in left shoulder: Secondary | ICD-10-CM | POA: Diagnosis not present

## 2023-06-03 DIAGNOSIS — Z9181 History of falling: Secondary | ICD-10-CM | POA: Diagnosis not present

## 2023-06-03 DIAGNOSIS — R2681 Unsteadiness on feet: Secondary | ICD-10-CM | POA: Diagnosis not present

## 2023-06-05 DIAGNOSIS — R41841 Cognitive communication deficit: Secondary | ICD-10-CM | POA: Diagnosis not present

## 2023-06-05 DIAGNOSIS — G4700 Insomnia, unspecified: Secondary | ICD-10-CM | POA: Diagnosis not present

## 2023-06-05 DIAGNOSIS — R4181 Age-related cognitive decline: Secondary | ICD-10-CM | POA: Diagnosis not present

## 2023-06-05 DIAGNOSIS — F02B3 Dementia in other diseases classified elsewhere, moderate, with mood disturbance: Secondary | ICD-10-CM | POA: Diagnosis not present

## 2023-06-05 DIAGNOSIS — G309 Alzheimer's disease, unspecified: Secondary | ICD-10-CM | POA: Diagnosis not present

## 2023-06-07 DIAGNOSIS — Z9181 History of falling: Secondary | ICD-10-CM | POA: Diagnosis not present

## 2023-06-07 DIAGNOSIS — R2681 Unsteadiness on feet: Secondary | ICD-10-CM | POA: Diagnosis not present

## 2023-06-07 DIAGNOSIS — M6281 Muscle weakness (generalized): Secondary | ICD-10-CM | POA: Diagnosis not present

## 2023-06-07 DIAGNOSIS — R262 Difficulty in walking, not elsewhere classified: Secondary | ICD-10-CM | POA: Diagnosis not present

## 2023-06-07 DIAGNOSIS — M25512 Pain in left shoulder: Secondary | ICD-10-CM | POA: Diagnosis not present

## 2023-06-08 DIAGNOSIS — C61 Malignant neoplasm of prostate: Secondary | ICD-10-CM | POA: Diagnosis not present

## 2023-06-08 DIAGNOSIS — N5201 Erectile dysfunction due to arterial insufficiency: Secondary | ICD-10-CM | POA: Diagnosis not present

## 2023-06-08 DIAGNOSIS — R2681 Unsteadiness on feet: Secondary | ICD-10-CM | POA: Diagnosis not present

## 2023-06-08 DIAGNOSIS — M25512 Pain in left shoulder: Secondary | ICD-10-CM | POA: Diagnosis not present

## 2023-06-08 DIAGNOSIS — R3121 Asymptomatic microscopic hematuria: Secondary | ICD-10-CM | POA: Diagnosis not present

## 2023-06-08 DIAGNOSIS — Z9181 History of falling: Secondary | ICD-10-CM | POA: Diagnosis not present

## 2023-06-09 DIAGNOSIS — R2681 Unsteadiness on feet: Secondary | ICD-10-CM | POA: Diagnosis not present

## 2023-06-09 DIAGNOSIS — M25512 Pain in left shoulder: Secondary | ICD-10-CM | POA: Diagnosis not present

## 2023-06-09 DIAGNOSIS — Z9181 History of falling: Secondary | ICD-10-CM | POA: Diagnosis not present

## 2023-06-10 DIAGNOSIS — R41841 Cognitive communication deficit: Secondary | ICD-10-CM | POA: Diagnosis not present

## 2023-06-10 DIAGNOSIS — Z9181 History of falling: Secondary | ICD-10-CM | POA: Diagnosis not present

## 2023-06-10 DIAGNOSIS — M25512 Pain in left shoulder: Secondary | ICD-10-CM | POA: Diagnosis not present

## 2023-06-10 DIAGNOSIS — R2681 Unsteadiness on feet: Secondary | ICD-10-CM | POA: Diagnosis not present

## 2023-06-10 DIAGNOSIS — R4181 Age-related cognitive decline: Secondary | ICD-10-CM | POA: Diagnosis not present

## 2023-06-11 DIAGNOSIS — M6281 Muscle weakness (generalized): Secondary | ICD-10-CM | POA: Diagnosis not present

## 2023-06-11 DIAGNOSIS — R262 Difficulty in walking, not elsewhere classified: Secondary | ICD-10-CM | POA: Diagnosis not present

## 2023-06-11 DIAGNOSIS — M25512 Pain in left shoulder: Secondary | ICD-10-CM | POA: Diagnosis not present

## 2023-06-11 DIAGNOSIS — R2681 Unsteadiness on feet: Secondary | ICD-10-CM | POA: Diagnosis not present

## 2023-06-11 DIAGNOSIS — Z9181 History of falling: Secondary | ICD-10-CM | POA: Diagnosis not present

## 2023-06-12 DIAGNOSIS — R4181 Age-related cognitive decline: Secondary | ICD-10-CM | POA: Diagnosis not present

## 2023-06-12 DIAGNOSIS — R41841 Cognitive communication deficit: Secondary | ICD-10-CM | POA: Diagnosis not present

## 2023-06-14 DIAGNOSIS — R262 Difficulty in walking, not elsewhere classified: Secondary | ICD-10-CM | POA: Diagnosis not present

## 2023-06-14 DIAGNOSIS — R2681 Unsteadiness on feet: Secondary | ICD-10-CM | POA: Diagnosis not present

## 2023-06-14 DIAGNOSIS — M6281 Muscle weakness (generalized): Secondary | ICD-10-CM | POA: Diagnosis not present

## 2023-06-14 DIAGNOSIS — M25512 Pain in left shoulder: Secondary | ICD-10-CM | POA: Diagnosis not present

## 2023-06-14 DIAGNOSIS — Z9181 History of falling: Secondary | ICD-10-CM | POA: Diagnosis not present

## 2023-06-15 DIAGNOSIS — M25512 Pain in left shoulder: Secondary | ICD-10-CM | POA: Diagnosis not present

## 2023-06-15 DIAGNOSIS — R2681 Unsteadiness on feet: Secondary | ICD-10-CM | POA: Diagnosis not present

## 2023-06-15 DIAGNOSIS — Z9181 History of falling: Secondary | ICD-10-CM | POA: Diagnosis not present

## 2023-06-16 DIAGNOSIS — E782 Mixed hyperlipidemia: Secondary | ICD-10-CM | POA: Diagnosis not present

## 2023-06-16 DIAGNOSIS — R262 Difficulty in walking, not elsewhere classified: Secondary | ICD-10-CM | POA: Diagnosis not present

## 2023-06-16 DIAGNOSIS — I10 Essential (primary) hypertension: Secondary | ICD-10-CM | POA: Diagnosis not present

## 2023-06-16 DIAGNOSIS — G4700 Insomnia, unspecified: Secondary | ICD-10-CM | POA: Diagnosis not present

## 2023-06-16 DIAGNOSIS — Z8546 Personal history of malignant neoplasm of prostate: Secondary | ICD-10-CM | POA: Diagnosis not present

## 2023-06-16 DIAGNOSIS — M6281 Muscle weakness (generalized): Secondary | ICD-10-CM | POA: Diagnosis not present

## 2023-06-17 DIAGNOSIS — M6281 Muscle weakness (generalized): Secondary | ICD-10-CM | POA: Diagnosis not present

## 2023-06-17 DIAGNOSIS — R262 Difficulty in walking, not elsewhere classified: Secondary | ICD-10-CM | POA: Diagnosis not present

## 2023-06-19 DIAGNOSIS — R262 Difficulty in walking, not elsewhere classified: Secondary | ICD-10-CM | POA: Diagnosis not present

## 2023-06-19 DIAGNOSIS — M6281 Muscle weakness (generalized): Secondary | ICD-10-CM | POA: Diagnosis not present

## 2023-06-23 DIAGNOSIS — M6281 Muscle weakness (generalized): Secondary | ICD-10-CM | POA: Diagnosis not present

## 2023-06-23 DIAGNOSIS — R262 Difficulty in walking, not elsewhere classified: Secondary | ICD-10-CM | POA: Diagnosis not present

## 2023-06-25 DIAGNOSIS — R262 Difficulty in walking, not elsewhere classified: Secondary | ICD-10-CM | POA: Diagnosis not present

## 2023-06-25 DIAGNOSIS — M6281 Muscle weakness (generalized): Secondary | ICD-10-CM | POA: Diagnosis not present

## 2023-06-26 DIAGNOSIS — R262 Difficulty in walking, not elsewhere classified: Secondary | ICD-10-CM | POA: Diagnosis not present

## 2023-06-26 DIAGNOSIS — M6281 Muscle weakness (generalized): Secondary | ICD-10-CM | POA: Diagnosis not present

## 2023-06-28 DIAGNOSIS — R262 Difficulty in walking, not elsewhere classified: Secondary | ICD-10-CM | POA: Diagnosis not present

## 2023-06-28 DIAGNOSIS — M6281 Muscle weakness (generalized): Secondary | ICD-10-CM | POA: Diagnosis not present

## 2023-06-29 DIAGNOSIS — M6281 Muscle weakness (generalized): Secondary | ICD-10-CM | POA: Diagnosis not present

## 2023-06-29 DIAGNOSIS — R262 Difficulty in walking, not elsewhere classified: Secondary | ICD-10-CM | POA: Diagnosis not present

## 2023-07-01 DIAGNOSIS — R262 Difficulty in walking, not elsewhere classified: Secondary | ICD-10-CM | POA: Diagnosis not present

## 2023-07-01 DIAGNOSIS — M6281 Muscle weakness (generalized): Secondary | ICD-10-CM | POA: Diagnosis not present

## 2023-07-02 DIAGNOSIS — M6281 Muscle weakness (generalized): Secondary | ICD-10-CM | POA: Diagnosis not present

## 2023-07-02 DIAGNOSIS — R262 Difficulty in walking, not elsewhere classified: Secondary | ICD-10-CM | POA: Diagnosis not present

## 2023-07-03 DIAGNOSIS — G309 Alzheimer's disease, unspecified: Secondary | ICD-10-CM | POA: Diagnosis not present

## 2023-07-03 DIAGNOSIS — F02B3 Dementia in other diseases classified elsewhere, moderate, with mood disturbance: Secondary | ICD-10-CM | POA: Diagnosis not present

## 2023-07-03 DIAGNOSIS — G4700 Insomnia, unspecified: Secondary | ICD-10-CM | POA: Diagnosis not present

## 2023-07-03 DIAGNOSIS — N401 Enlarged prostate with lower urinary tract symptoms: Secondary | ICD-10-CM | POA: Diagnosis not present

## 2023-07-06 DIAGNOSIS — R262 Difficulty in walking, not elsewhere classified: Secondary | ICD-10-CM | POA: Diagnosis not present

## 2023-07-06 DIAGNOSIS — M6281 Muscle weakness (generalized): Secondary | ICD-10-CM | POA: Diagnosis not present

## 2023-07-08 DIAGNOSIS — M6281 Muscle weakness (generalized): Secondary | ICD-10-CM | POA: Diagnosis not present

## 2023-07-08 DIAGNOSIS — R262 Difficulty in walking, not elsewhere classified: Secondary | ICD-10-CM | POA: Diagnosis not present

## 2023-07-09 DIAGNOSIS — R262 Difficulty in walking, not elsewhere classified: Secondary | ICD-10-CM | POA: Diagnosis not present

## 2023-07-09 DIAGNOSIS — M6281 Muscle weakness (generalized): Secondary | ICD-10-CM | POA: Diagnosis not present

## 2023-07-10 DIAGNOSIS — G309 Alzheimer's disease, unspecified: Secondary | ICD-10-CM | POA: Diagnosis not present

## 2023-07-10 DIAGNOSIS — F02B3 Dementia in other diseases classified elsewhere, moderate, with mood disturbance: Secondary | ICD-10-CM | POA: Diagnosis not present

## 2023-07-13 DIAGNOSIS — L578 Other skin changes due to chronic exposure to nonionizing radiation: Secondary | ICD-10-CM | POA: Diagnosis not present

## 2023-07-13 DIAGNOSIS — L821 Other seborrheic keratosis: Secondary | ICD-10-CM | POA: Diagnosis not present

## 2023-07-13 DIAGNOSIS — W908XXS Exposure to other nonionizing radiation, sequela: Secondary | ICD-10-CM | POA: Diagnosis not present

## 2023-07-13 DIAGNOSIS — C44319 Basal cell carcinoma of skin of other parts of face: Secondary | ICD-10-CM | POA: Diagnosis not present

## 2023-07-13 DIAGNOSIS — Z859 Personal history of malignant neoplasm, unspecified: Secondary | ICD-10-CM | POA: Diagnosis not present

## 2023-07-13 DIAGNOSIS — D485 Neoplasm of uncertain behavior of skin: Secondary | ICD-10-CM | POA: Diagnosis not present

## 2023-07-15 DIAGNOSIS — R262 Difficulty in walking, not elsewhere classified: Secondary | ICD-10-CM | POA: Diagnosis not present

## 2023-07-15 DIAGNOSIS — M6281 Muscle weakness (generalized): Secondary | ICD-10-CM | POA: Diagnosis not present

## 2023-07-16 DIAGNOSIS — E559 Vitamin D deficiency, unspecified: Secondary | ICD-10-CM | POA: Diagnosis not present

## 2023-07-16 DIAGNOSIS — R262 Difficulty in walking, not elsewhere classified: Secondary | ICD-10-CM | POA: Diagnosis not present

## 2023-07-16 DIAGNOSIS — F331 Major depressive disorder, recurrent, moderate: Secondary | ICD-10-CM | POA: Diagnosis not present

## 2023-07-16 DIAGNOSIS — M2041 Other hammer toe(s) (acquired), right foot: Secondary | ICD-10-CM | POA: Diagnosis not present

## 2023-07-16 DIAGNOSIS — R7303 Prediabetes: Secondary | ICD-10-CM | POA: Diagnosis not present

## 2023-07-16 DIAGNOSIS — I10 Essential (primary) hypertension: Secondary | ICD-10-CM | POA: Diagnosis not present

## 2023-07-16 DIAGNOSIS — B351 Tinea unguium: Secondary | ICD-10-CM | POA: Diagnosis not present

## 2023-07-16 DIAGNOSIS — M6281 Muscle weakness (generalized): Secondary | ICD-10-CM | POA: Diagnosis not present

## 2023-07-17 DIAGNOSIS — R262 Difficulty in walking, not elsewhere classified: Secondary | ICD-10-CM | POA: Diagnosis not present

## 2023-07-17 DIAGNOSIS — M6281 Muscle weakness (generalized): Secondary | ICD-10-CM | POA: Diagnosis not present

## 2023-07-18 DIAGNOSIS — M6281 Muscle weakness (generalized): Secondary | ICD-10-CM | POA: Diagnosis not present

## 2023-07-18 DIAGNOSIS — R262 Difficulty in walking, not elsewhere classified: Secondary | ICD-10-CM | POA: Diagnosis not present

## 2023-07-23 DIAGNOSIS — R0989 Other specified symptoms and signs involving the circulatory and respiratory systems: Secondary | ICD-10-CM | POA: Diagnosis not present

## 2023-07-23 DIAGNOSIS — E119 Type 2 diabetes mellitus without complications: Secondary | ICD-10-CM | POA: Diagnosis not present

## 2023-07-23 DIAGNOSIS — R059 Cough, unspecified: Secondary | ICD-10-CM | POA: Diagnosis not present

## 2023-07-28 DIAGNOSIS — I10 Essential (primary) hypertension: Secondary | ICD-10-CM | POA: Diagnosis not present

## 2023-07-28 DIAGNOSIS — R059 Cough, unspecified: Secondary | ICD-10-CM | POA: Diagnosis not present

## 2023-07-30 DIAGNOSIS — G309 Alzheimer's disease, unspecified: Secondary | ICD-10-CM | POA: Diagnosis not present

## 2023-07-30 DIAGNOSIS — Z79899 Other long term (current) drug therapy: Secondary | ICD-10-CM | POA: Diagnosis not present

## 2023-07-30 DIAGNOSIS — N401 Enlarged prostate with lower urinary tract symptoms: Secondary | ICD-10-CM | POA: Diagnosis not present

## 2023-07-30 DIAGNOSIS — R509 Fever, unspecified: Secondary | ICD-10-CM | POA: Diagnosis not present

## 2023-07-31 DIAGNOSIS — G309 Alzheimer's disease, unspecified: Secondary | ICD-10-CM | POA: Diagnosis not present

## 2023-07-31 DIAGNOSIS — F02C18 Dementia in other diseases classified elsewhere, severe, with other behavioral disturbance: Secondary | ICD-10-CM | POA: Diagnosis not present

## 2023-07-31 DIAGNOSIS — G4700 Insomnia, unspecified: Secondary | ICD-10-CM | POA: Diagnosis not present

## 2023-07-31 DIAGNOSIS — F331 Major depressive disorder, recurrent, moderate: Secondary | ICD-10-CM | POA: Diagnosis not present

## 2023-08-13 DIAGNOSIS — I10 Essential (primary) hypertension: Secondary | ICD-10-CM | POA: Diagnosis not present

## 2023-08-13 DIAGNOSIS — E559 Vitamin D deficiency, unspecified: Secondary | ICD-10-CM | POA: Diagnosis not present

## 2023-08-13 DIAGNOSIS — E785 Hyperlipidemia, unspecified: Secondary | ICD-10-CM | POA: Diagnosis not present

## 2023-08-13 DIAGNOSIS — F02C18 Dementia in other diseases classified elsewhere, severe, with other behavioral disturbance: Secondary | ICD-10-CM | POA: Diagnosis not present

## 2023-08-13 DIAGNOSIS — N401 Enlarged prostate with lower urinary tract symptoms: Secondary | ICD-10-CM | POA: Diagnosis not present

## 2023-08-13 DIAGNOSIS — G309 Alzheimer's disease, unspecified: Secondary | ICD-10-CM | POA: Diagnosis not present

## 2023-08-13 DIAGNOSIS — G4700 Insomnia, unspecified: Secondary | ICD-10-CM | POA: Diagnosis not present

## 2023-08-13 DIAGNOSIS — Z0000 Encounter for general adult medical examination without abnormal findings: Secondary | ICD-10-CM | POA: Diagnosis not present

## 2023-08-18 DIAGNOSIS — I10 Essential (primary) hypertension: Secondary | ICD-10-CM | POA: Diagnosis not present

## 2023-08-18 DIAGNOSIS — E782 Mixed hyperlipidemia: Secondary | ICD-10-CM | POA: Diagnosis not present

## 2023-08-18 DIAGNOSIS — N400 Enlarged prostate without lower urinary tract symptoms: Secondary | ICD-10-CM | POA: Diagnosis not present

## 2023-08-18 DIAGNOSIS — E118 Type 2 diabetes mellitus with unspecified complications: Secondary | ICD-10-CM | POA: Diagnosis not present

## 2023-08-19 DIAGNOSIS — F331 Major depressive disorder, recurrent, moderate: Secondary | ICD-10-CM | POA: Diagnosis not present

## 2023-08-19 DIAGNOSIS — I10 Essential (primary) hypertension: Secondary | ICD-10-CM | POA: Diagnosis not present

## 2023-08-19 DIAGNOSIS — E559 Vitamin D deficiency, unspecified: Secondary | ICD-10-CM | POA: Diagnosis not present

## 2023-08-20 DIAGNOSIS — G301 Alzheimer's disease with late onset: Secondary | ICD-10-CM | POA: Diagnosis not present

## 2023-08-20 DIAGNOSIS — F331 Major depressive disorder, recurrent, moderate: Secondary | ICD-10-CM | POA: Diagnosis not present

## 2023-08-27 DIAGNOSIS — M159 Polyosteoarthritis, unspecified: Secondary | ICD-10-CM | POA: Diagnosis not present

## 2023-08-27 DIAGNOSIS — E119 Type 2 diabetes mellitus without complications: Secondary | ICD-10-CM | POA: Diagnosis not present

## 2023-08-28 DIAGNOSIS — G4700 Insomnia, unspecified: Secondary | ICD-10-CM | POA: Diagnosis not present

## 2023-08-28 DIAGNOSIS — F02C18 Dementia in other diseases classified elsewhere, severe, with other behavioral disturbance: Secondary | ICD-10-CM | POA: Diagnosis not present

## 2023-08-28 DIAGNOSIS — G301 Alzheimer's disease with late onset: Secondary | ICD-10-CM | POA: Diagnosis not present

## 2023-08-28 DIAGNOSIS — F331 Major depressive disorder, recurrent, moderate: Secondary | ICD-10-CM | POA: Diagnosis not present

## 2023-09-08 DIAGNOSIS — M25562 Pain in left knee: Secondary | ICD-10-CM | POA: Diagnosis not present

## 2023-09-08 DIAGNOSIS — G301 Alzheimer's disease with late onset: Secondary | ICD-10-CM | POA: Diagnosis not present

## 2023-09-08 DIAGNOSIS — F02C18 Dementia in other diseases classified elsewhere, severe, with other behavioral disturbance: Secondary | ICD-10-CM | POA: Diagnosis not present

## 2023-09-08 DIAGNOSIS — M25561 Pain in right knee: Secondary | ICD-10-CM | POA: Diagnosis not present

## 2023-09-10 DIAGNOSIS — I10 Essential (primary) hypertension: Secondary | ICD-10-CM | POA: Diagnosis not present

## 2023-09-15 DIAGNOSIS — E119 Type 2 diabetes mellitus without complications: Secondary | ICD-10-CM | POA: Diagnosis not present

## 2023-09-15 DIAGNOSIS — F02C18 Dementia in other diseases classified elsewhere, severe, with other behavioral disturbance: Secondary | ICD-10-CM | POA: Diagnosis not present

## 2023-09-15 DIAGNOSIS — M159 Polyosteoarthritis, unspecified: Secondary | ICD-10-CM | POA: Diagnosis not present

## 2023-09-15 DIAGNOSIS — G301 Alzheimer's disease with late onset: Secondary | ICD-10-CM | POA: Diagnosis not present

## 2023-09-15 DIAGNOSIS — I10 Essential (primary) hypertension: Secondary | ICD-10-CM | POA: Diagnosis not present

## 2023-09-17 DIAGNOSIS — G301 Alzheimer's disease with late onset: Secondary | ICD-10-CM | POA: Diagnosis not present

## 2023-09-17 DIAGNOSIS — F331 Major depressive disorder, recurrent, moderate: Secondary | ICD-10-CM | POA: Diagnosis not present

## 2023-09-23 DIAGNOSIS — E119 Type 2 diabetes mellitus without complications: Secondary | ICD-10-CM | POA: Diagnosis not present

## 2023-09-23 DIAGNOSIS — M159 Polyosteoarthritis, unspecified: Secondary | ICD-10-CM | POA: Diagnosis not present

## 2023-09-25 DIAGNOSIS — F331 Major depressive disorder, recurrent, moderate: Secondary | ICD-10-CM | POA: Diagnosis not present

## 2023-09-25 DIAGNOSIS — F02C18 Dementia in other diseases classified elsewhere, severe, with other behavioral disturbance: Secondary | ICD-10-CM | POA: Diagnosis not present

## 2023-09-25 DIAGNOSIS — G4700 Insomnia, unspecified: Secondary | ICD-10-CM | POA: Diagnosis not present

## 2023-09-25 DIAGNOSIS — G301 Alzheimer's disease with late onset: Secondary | ICD-10-CM | POA: Diagnosis not present

## 2023-10-13 DIAGNOSIS — B351 Tinea unguium: Secondary | ICD-10-CM | POA: Diagnosis not present

## 2023-10-13 DIAGNOSIS — M2041 Other hammer toe(s) (acquired), right foot: Secondary | ICD-10-CM | POA: Diagnosis not present

## 2023-10-20 DIAGNOSIS — I10 Essential (primary) hypertension: Secondary | ICD-10-CM | POA: Diagnosis not present

## 2023-10-20 DIAGNOSIS — E785 Hyperlipidemia, unspecified: Secondary | ICD-10-CM | POA: Diagnosis not present

## 2023-10-20 DIAGNOSIS — Z8546 Personal history of malignant neoplasm of prostate: Secondary | ICD-10-CM | POA: Diagnosis not present

## 2023-10-20 DIAGNOSIS — G4700 Insomnia, unspecified: Secondary | ICD-10-CM | POA: Diagnosis not present

## 2023-10-23 DIAGNOSIS — G4700 Insomnia, unspecified: Secondary | ICD-10-CM | POA: Diagnosis not present

## 2023-10-23 DIAGNOSIS — F331 Major depressive disorder, recurrent, moderate: Secondary | ICD-10-CM | POA: Diagnosis not present

## 2023-10-23 DIAGNOSIS — G301 Alzheimer's disease with late onset: Secondary | ICD-10-CM | POA: Diagnosis not present

## 2023-10-23 DIAGNOSIS — F02C18 Dementia in other diseases classified elsewhere, severe, with other behavioral disturbance: Secondary | ICD-10-CM | POA: Diagnosis not present

## 2023-10-27 DIAGNOSIS — G309 Alzheimer's disease, unspecified: Secondary | ICD-10-CM | POA: Diagnosis not present

## 2023-10-29 DIAGNOSIS — G301 Alzheimer's disease with late onset: Secondary | ICD-10-CM | POA: Diagnosis not present

## 2023-10-29 DIAGNOSIS — E118 Type 2 diabetes mellitus with unspecified complications: Secondary | ICD-10-CM | POA: Diagnosis not present

## 2023-10-29 DIAGNOSIS — E119 Type 2 diabetes mellitus without complications: Secondary | ICD-10-CM | POA: Diagnosis not present

## 2023-10-29 DIAGNOSIS — F331 Major depressive disorder, recurrent, moderate: Secondary | ICD-10-CM | POA: Diagnosis not present

## 2023-11-05 DIAGNOSIS — E119 Type 2 diabetes mellitus without complications: Secondary | ICD-10-CM | POA: Diagnosis not present

## 2023-11-05 DIAGNOSIS — G301 Alzheimer's disease with late onset: Secondary | ICD-10-CM | POA: Diagnosis not present

## 2023-11-05 DIAGNOSIS — Z9181 History of falling: Secondary | ICD-10-CM | POA: Diagnosis not present

## 2023-11-05 DIAGNOSIS — F02B3 Dementia in other diseases classified elsewhere, moderate, with mood disturbance: Secondary | ICD-10-CM | POA: Diagnosis not present

## 2023-11-05 DIAGNOSIS — S0081XA Abrasion of other part of head, initial encounter: Secondary | ICD-10-CM | POA: Diagnosis not present

## 2023-11-05 DIAGNOSIS — Z131 Encounter for screening for diabetes mellitus: Secondary | ICD-10-CM | POA: Diagnosis not present

## 2023-11-05 DIAGNOSIS — S80211A Abrasion, right knee, initial encounter: Secondary | ICD-10-CM | POA: Diagnosis not present

## 2023-11-17 DIAGNOSIS — G301 Alzheimer's disease with late onset: Secondary | ICD-10-CM | POA: Diagnosis not present

## 2023-11-17 DIAGNOSIS — F02B3 Dementia in other diseases classified elsewhere, moderate, with mood disturbance: Secondary | ICD-10-CM | POA: Diagnosis not present

## 2023-11-17 DIAGNOSIS — E119 Type 2 diabetes mellitus without complications: Secondary | ICD-10-CM | POA: Diagnosis not present

## 2023-11-17 DIAGNOSIS — N400 Enlarged prostate without lower urinary tract symptoms: Secondary | ICD-10-CM | POA: Diagnosis not present

## 2023-11-20 DIAGNOSIS — F0390 Unspecified dementia without behavioral disturbance: Secondary | ICD-10-CM | POA: Diagnosis not present

## 2023-11-20 DIAGNOSIS — Z1330 Encounter for screening examination for mental health and behavioral disorders, unspecified: Secondary | ICD-10-CM | POA: Diagnosis not present

## 2023-11-27 DIAGNOSIS — G301 Alzheimer's disease with late onset: Secondary | ICD-10-CM | POA: Diagnosis not present

## 2023-11-27 DIAGNOSIS — G4700 Insomnia, unspecified: Secondary | ICD-10-CM | POA: Diagnosis not present

## 2023-11-27 DIAGNOSIS — E119 Type 2 diabetes mellitus without complications: Secondary | ICD-10-CM | POA: Diagnosis not present

## 2023-11-27 DIAGNOSIS — F411 Generalized anxiety disorder: Secondary | ICD-10-CM | POA: Diagnosis not present

## 2023-11-27 DIAGNOSIS — F02C18 Dementia in other diseases classified elsewhere, severe, with other behavioral disturbance: Secondary | ICD-10-CM | POA: Diagnosis not present

## 2023-11-27 DIAGNOSIS — F331 Major depressive disorder, recurrent, moderate: Secondary | ICD-10-CM | POA: Diagnosis not present

## 2023-11-27 DIAGNOSIS — G309 Alzheimer's disease, unspecified: Secondary | ICD-10-CM | POA: Diagnosis not present

## 2023-11-30 DIAGNOSIS — C61 Malignant neoplasm of prostate: Secondary | ICD-10-CM | POA: Diagnosis not present

## 2023-12-07 DIAGNOSIS — C61 Malignant neoplasm of prostate: Secondary | ICD-10-CM | POA: Diagnosis not present

## 2023-12-07 DIAGNOSIS — R3121 Asymptomatic microscopic hematuria: Secondary | ICD-10-CM | POA: Diagnosis not present

## 2023-12-07 DIAGNOSIS — N5201 Erectile dysfunction due to arterial insufficiency: Secondary | ICD-10-CM | POA: Diagnosis not present

## 2023-12-15 DIAGNOSIS — E785 Hyperlipidemia, unspecified: Secondary | ICD-10-CM | POA: Diagnosis not present

## 2023-12-15 DIAGNOSIS — G4700 Insomnia, unspecified: Secondary | ICD-10-CM | POA: Diagnosis not present

## 2023-12-15 DIAGNOSIS — I10 Essential (primary) hypertension: Secondary | ICD-10-CM | POA: Diagnosis not present

## 2023-12-15 DIAGNOSIS — E118 Type 2 diabetes mellitus with unspecified complications: Secondary | ICD-10-CM | POA: Diagnosis not present

## 2023-12-24 DIAGNOSIS — F411 Generalized anxiety disorder: Secondary | ICD-10-CM | POA: Diagnosis not present

## 2023-12-24 DIAGNOSIS — E119 Type 2 diabetes mellitus without complications: Secondary | ICD-10-CM | POA: Diagnosis not present

## 2023-12-24 DIAGNOSIS — G309 Alzheimer's disease, unspecified: Secondary | ICD-10-CM | POA: Diagnosis not present

## 2023-12-24 DIAGNOSIS — N182 Chronic kidney disease, stage 2 (mild): Secondary | ICD-10-CM | POA: Diagnosis not present

## 2023-12-25 DIAGNOSIS — F5105 Insomnia due to other mental disorder: Secondary | ICD-10-CM | POA: Diagnosis not present

## 2023-12-25 DIAGNOSIS — F331 Major depressive disorder, recurrent, moderate: Secondary | ICD-10-CM | POA: Diagnosis not present

## 2023-12-25 DIAGNOSIS — F02C18 Dementia in other diseases classified elsewhere, severe, with other behavioral disturbance: Secondary | ICD-10-CM | POA: Diagnosis not present

## 2023-12-25 DIAGNOSIS — G301 Alzheimer's disease with late onset: Secondary | ICD-10-CM | POA: Diagnosis not present

## 2024-01-05 DIAGNOSIS — L539 Erythematous condition, unspecified: Secondary | ICD-10-CM | POA: Diagnosis not present

## 2024-01-05 DIAGNOSIS — S60822A Blister (nonthermal) of left wrist, initial encounter: Secondary | ICD-10-CM | POA: Diagnosis not present

## 2024-01-12 DIAGNOSIS — S60822D Blister (nonthermal) of left wrist, subsequent encounter: Secondary | ICD-10-CM | POA: Diagnosis not present

## 2024-01-12 DIAGNOSIS — E1122 Type 2 diabetes mellitus with diabetic chronic kidney disease: Secondary | ICD-10-CM | POA: Diagnosis not present

## 2024-01-12 DIAGNOSIS — N400 Enlarged prostate without lower urinary tract symptoms: Secondary | ICD-10-CM | POA: Diagnosis not present

## 2024-01-12 DIAGNOSIS — N182 Chronic kidney disease, stage 2 (mild): Secondary | ICD-10-CM | POA: Diagnosis not present

## 2024-01-18 DIAGNOSIS — H2513 Age-related nuclear cataract, bilateral: Secondary | ICD-10-CM | POA: Diagnosis not present

## 2024-01-18 DIAGNOSIS — E119 Type 2 diabetes mellitus without complications: Secondary | ICD-10-CM | POA: Diagnosis not present

## 2024-01-18 DIAGNOSIS — Z7984 Long term (current) use of oral hypoglycemic drugs: Secondary | ICD-10-CM | POA: Diagnosis not present

## 2024-01-21 DIAGNOSIS — M2041 Other hammer toe(s) (acquired), right foot: Secondary | ICD-10-CM | POA: Diagnosis not present

## 2024-01-21 DIAGNOSIS — E119 Type 2 diabetes mellitus without complications: Secondary | ICD-10-CM | POA: Diagnosis not present

## 2024-01-21 DIAGNOSIS — B351 Tinea unguium: Secondary | ICD-10-CM | POA: Diagnosis not present

## 2024-01-21 DIAGNOSIS — N182 Chronic kidney disease, stage 2 (mild): Secondary | ICD-10-CM | POA: Diagnosis not present

## 2024-01-22 DIAGNOSIS — F331 Major depressive disorder, recurrent, moderate: Secondary | ICD-10-CM | POA: Diagnosis not present

## 2024-01-22 DIAGNOSIS — F02C18 Dementia in other diseases classified elsewhere, severe, with other behavioral disturbance: Secondary | ICD-10-CM | POA: Diagnosis not present

## 2024-01-22 DIAGNOSIS — G4700 Insomnia, unspecified: Secondary | ICD-10-CM | POA: Diagnosis not present

## 2024-01-22 DIAGNOSIS — G301 Alzheimer's disease with late onset: Secondary | ICD-10-CM | POA: Diagnosis not present

## 2024-01-27 ENCOUNTER — Other Ambulatory Visit: Payer: Self-pay | Admitting: Internal Medicine

## 2024-02-11 DIAGNOSIS — E118 Type 2 diabetes mellitus with unspecified complications: Secondary | ICD-10-CM | POA: Diagnosis not present

## 2024-02-11 DIAGNOSIS — F02B3 Dementia in other diseases classified elsewhere, moderate, with mood disturbance: Secondary | ICD-10-CM | POA: Diagnosis not present

## 2024-02-11 DIAGNOSIS — I10 Essential (primary) hypertension: Secondary | ICD-10-CM | POA: Diagnosis not present

## 2024-02-11 DIAGNOSIS — G301 Alzheimer's disease with late onset: Secondary | ICD-10-CM | POA: Diagnosis not present

## 2024-02-15 DIAGNOSIS — M62512 Muscle wasting and atrophy, not elsewhere classified, left shoulder: Secondary | ICD-10-CM | POA: Diagnosis not present

## 2024-02-15 DIAGNOSIS — R278 Other lack of coordination: Secondary | ICD-10-CM | POA: Diagnosis not present

## 2024-02-15 DIAGNOSIS — M62511 Muscle wasting and atrophy, not elsewhere classified, right shoulder: Secondary | ICD-10-CM | POA: Diagnosis not present

## 2024-02-15 DIAGNOSIS — R296 Repeated falls: Secondary | ICD-10-CM | POA: Diagnosis not present

## 2024-02-17 DIAGNOSIS — Z7984 Long term (current) use of oral hypoglycemic drugs: Secondary | ICD-10-CM | POA: Diagnosis not present

## 2024-02-17 DIAGNOSIS — E119 Type 2 diabetes mellitus without complications: Secondary | ICD-10-CM | POA: Diagnosis not present

## 2024-02-17 DIAGNOSIS — M62512 Muscle wasting and atrophy, not elsewhere classified, left shoulder: Secondary | ICD-10-CM | POA: Diagnosis not present

## 2024-02-17 DIAGNOSIS — R278 Other lack of coordination: Secondary | ICD-10-CM | POA: Diagnosis not present

## 2024-02-17 DIAGNOSIS — F0390 Unspecified dementia without behavioral disturbance: Secondary | ICD-10-CM | POA: Diagnosis not present

## 2024-02-17 DIAGNOSIS — M62511 Muscle wasting and atrophy, not elsewhere classified, right shoulder: Secondary | ICD-10-CM | POA: Diagnosis not present

## 2024-02-17 DIAGNOSIS — I10 Essential (primary) hypertension: Secondary | ICD-10-CM | POA: Diagnosis not present

## 2024-02-17 DIAGNOSIS — R296 Repeated falls: Secondary | ICD-10-CM | POA: Diagnosis not present

## 2024-02-18 DIAGNOSIS — M62512 Muscle wasting and atrophy, not elsewhere classified, left shoulder: Secondary | ICD-10-CM | POA: Diagnosis not present

## 2024-02-18 DIAGNOSIS — M62511 Muscle wasting and atrophy, not elsewhere classified, right shoulder: Secondary | ICD-10-CM | POA: Diagnosis not present

## 2024-02-18 DIAGNOSIS — R278 Other lack of coordination: Secondary | ICD-10-CM | POA: Diagnosis not present

## 2024-02-18 DIAGNOSIS — R296 Repeated falls: Secondary | ICD-10-CM | POA: Diagnosis not present

## 2024-02-19 DIAGNOSIS — G4700 Insomnia, unspecified: Secondary | ICD-10-CM | POA: Diagnosis not present

## 2024-02-19 DIAGNOSIS — G301 Alzheimer's disease with late onset: Secondary | ICD-10-CM | POA: Diagnosis not present

## 2024-02-19 DIAGNOSIS — F02C0 Dementia in other diseases classified elsewhere, severe, without behavioral disturbance, psychotic disturbance, mood disturbance, and anxiety: Secondary | ICD-10-CM | POA: Diagnosis not present

## 2024-02-19 DIAGNOSIS — F331 Major depressive disorder, recurrent, moderate: Secondary | ICD-10-CM | POA: Diagnosis not present

## 2024-02-22 DIAGNOSIS — R2681 Unsteadiness on feet: Secondary | ICD-10-CM | POA: Diagnosis not present

## 2024-02-22 DIAGNOSIS — R278 Other lack of coordination: Secondary | ICD-10-CM | POA: Diagnosis not present

## 2024-02-23 DIAGNOSIS — M62512 Muscle wasting and atrophy, not elsewhere classified, left shoulder: Secondary | ICD-10-CM | POA: Diagnosis not present

## 2024-02-23 DIAGNOSIS — R296 Repeated falls: Secondary | ICD-10-CM | POA: Diagnosis not present

## 2024-02-23 DIAGNOSIS — R278 Other lack of coordination: Secondary | ICD-10-CM | POA: Diagnosis not present

## 2024-02-23 DIAGNOSIS — M62511 Muscle wasting and atrophy, not elsewhere classified, right shoulder: Secondary | ICD-10-CM | POA: Diagnosis not present

## 2024-02-24 DIAGNOSIS — R278 Other lack of coordination: Secondary | ICD-10-CM | POA: Diagnosis not present

## 2024-02-24 DIAGNOSIS — R2681 Unsteadiness on feet: Secondary | ICD-10-CM | POA: Diagnosis not present

## 2024-02-25 DIAGNOSIS — N182 Chronic kidney disease, stage 2 (mild): Secondary | ICD-10-CM | POA: Diagnosis not present

## 2024-02-25 DIAGNOSIS — E7800 Pure hypercholesterolemia, unspecified: Secondary | ICD-10-CM | POA: Diagnosis not present

## 2024-02-25 DIAGNOSIS — F411 Generalized anxiety disorder: Secondary | ICD-10-CM | POA: Diagnosis not present

## 2024-02-25 DIAGNOSIS — F0390 Unspecified dementia without behavioral disturbance: Secondary | ICD-10-CM | POA: Diagnosis not present

## 2024-02-26 DIAGNOSIS — M62511 Muscle wasting and atrophy, not elsewhere classified, right shoulder: Secondary | ICD-10-CM | POA: Diagnosis not present

## 2024-02-26 DIAGNOSIS — R278 Other lack of coordination: Secondary | ICD-10-CM | POA: Diagnosis not present

## 2024-02-26 DIAGNOSIS — R2681 Unsteadiness on feet: Secondary | ICD-10-CM | POA: Diagnosis not present

## 2024-02-26 DIAGNOSIS — M62512 Muscle wasting and atrophy, not elsewhere classified, left shoulder: Secondary | ICD-10-CM | POA: Diagnosis not present

## 2024-02-26 DIAGNOSIS — R296 Repeated falls: Secondary | ICD-10-CM | POA: Diagnosis not present

## 2024-02-29 DIAGNOSIS — R296 Repeated falls: Secondary | ICD-10-CM | POA: Diagnosis not present

## 2024-02-29 DIAGNOSIS — R2681 Unsteadiness on feet: Secondary | ICD-10-CM | POA: Diagnosis not present

## 2024-02-29 DIAGNOSIS — R278 Other lack of coordination: Secondary | ICD-10-CM | POA: Diagnosis not present

## 2024-02-29 DIAGNOSIS — M62512 Muscle wasting and atrophy, not elsewhere classified, left shoulder: Secondary | ICD-10-CM | POA: Diagnosis not present

## 2024-02-29 DIAGNOSIS — M62511 Muscle wasting and atrophy, not elsewhere classified, right shoulder: Secondary | ICD-10-CM | POA: Diagnosis not present

## 2024-03-01 DIAGNOSIS — R296 Repeated falls: Secondary | ICD-10-CM | POA: Diagnosis not present

## 2024-03-01 DIAGNOSIS — R278 Other lack of coordination: Secondary | ICD-10-CM | POA: Diagnosis not present

## 2024-03-01 DIAGNOSIS — M62512 Muscle wasting and atrophy, not elsewhere classified, left shoulder: Secondary | ICD-10-CM | POA: Diagnosis not present

## 2024-03-01 DIAGNOSIS — R2681 Unsteadiness on feet: Secondary | ICD-10-CM | POA: Diagnosis not present

## 2024-03-01 DIAGNOSIS — M62511 Muscle wasting and atrophy, not elsewhere classified, right shoulder: Secondary | ICD-10-CM | POA: Diagnosis not present

## 2024-03-02 DIAGNOSIS — M62512 Muscle wasting and atrophy, not elsewhere classified, left shoulder: Secondary | ICD-10-CM | POA: Diagnosis not present

## 2024-03-02 DIAGNOSIS — R278 Other lack of coordination: Secondary | ICD-10-CM | POA: Diagnosis not present

## 2024-03-02 DIAGNOSIS — M62511 Muscle wasting and atrophy, not elsewhere classified, right shoulder: Secondary | ICD-10-CM | POA: Diagnosis not present

## 2024-03-02 DIAGNOSIS — R296 Repeated falls: Secondary | ICD-10-CM | POA: Diagnosis not present

## 2024-03-03 DIAGNOSIS — R296 Repeated falls: Secondary | ICD-10-CM | POA: Diagnosis not present

## 2024-03-03 DIAGNOSIS — M62512 Muscle wasting and atrophy, not elsewhere classified, left shoulder: Secondary | ICD-10-CM | POA: Diagnosis not present

## 2024-03-03 DIAGNOSIS — R278 Other lack of coordination: Secondary | ICD-10-CM | POA: Diagnosis not present

## 2024-03-03 DIAGNOSIS — M62511 Muscle wasting and atrophy, not elsewhere classified, right shoulder: Secondary | ICD-10-CM | POA: Diagnosis not present

## 2024-03-04 DIAGNOSIS — R2681 Unsteadiness on feet: Secondary | ICD-10-CM | POA: Diagnosis not present

## 2024-03-04 DIAGNOSIS — R278 Other lack of coordination: Secondary | ICD-10-CM | POA: Diagnosis not present

## 2024-03-07 DIAGNOSIS — R2681 Unsteadiness on feet: Secondary | ICD-10-CM | POA: Diagnosis not present

## 2024-03-07 DIAGNOSIS — R278 Other lack of coordination: Secondary | ICD-10-CM | POA: Diagnosis not present

## 2024-03-08 DIAGNOSIS — M62512 Muscle wasting and atrophy, not elsewhere classified, left shoulder: Secondary | ICD-10-CM | POA: Diagnosis not present

## 2024-03-08 DIAGNOSIS — R296 Repeated falls: Secondary | ICD-10-CM | POA: Diagnosis not present

## 2024-03-08 DIAGNOSIS — E7800 Pure hypercholesterolemia, unspecified: Secondary | ICD-10-CM | POA: Diagnosis not present

## 2024-03-08 DIAGNOSIS — Z7984 Long term (current) use of oral hypoglycemic drugs: Secondary | ICD-10-CM | POA: Diagnosis not present

## 2024-03-08 DIAGNOSIS — M62511 Muscle wasting and atrophy, not elsewhere classified, right shoulder: Secondary | ICD-10-CM | POA: Diagnosis not present

## 2024-03-08 DIAGNOSIS — E118 Type 2 diabetes mellitus with unspecified complications: Secondary | ICD-10-CM | POA: Diagnosis not present

## 2024-03-08 DIAGNOSIS — N400 Enlarged prostate without lower urinary tract symptoms: Secondary | ICD-10-CM | POA: Diagnosis not present

## 2024-03-08 DIAGNOSIS — R2681 Unsteadiness on feet: Secondary | ICD-10-CM | POA: Diagnosis not present

## 2024-03-08 DIAGNOSIS — R278 Other lack of coordination: Secondary | ICD-10-CM | POA: Diagnosis not present

## 2024-03-09 DIAGNOSIS — M62511 Muscle wasting and atrophy, not elsewhere classified, right shoulder: Secondary | ICD-10-CM | POA: Diagnosis not present

## 2024-03-09 DIAGNOSIS — R278 Other lack of coordination: Secondary | ICD-10-CM | POA: Diagnosis not present

## 2024-03-09 DIAGNOSIS — R2681 Unsteadiness on feet: Secondary | ICD-10-CM | POA: Diagnosis not present

## 2024-03-09 DIAGNOSIS — M62512 Muscle wasting and atrophy, not elsewhere classified, left shoulder: Secondary | ICD-10-CM | POA: Diagnosis not present

## 2024-03-09 DIAGNOSIS — R296 Repeated falls: Secondary | ICD-10-CM | POA: Diagnosis not present

## 2024-03-11 DIAGNOSIS — M62511 Muscle wasting and atrophy, not elsewhere classified, right shoulder: Secondary | ICD-10-CM | POA: Diagnosis not present

## 2024-03-11 DIAGNOSIS — M62512 Muscle wasting and atrophy, not elsewhere classified, left shoulder: Secondary | ICD-10-CM | POA: Diagnosis not present

## 2024-03-11 DIAGNOSIS — R278 Other lack of coordination: Secondary | ICD-10-CM | POA: Diagnosis not present

## 2024-03-11 DIAGNOSIS — R296 Repeated falls: Secondary | ICD-10-CM | POA: Diagnosis not present

## 2024-03-14 DIAGNOSIS — M62511 Muscle wasting and atrophy, not elsewhere classified, right shoulder: Secondary | ICD-10-CM | POA: Diagnosis not present

## 2024-03-14 DIAGNOSIS — R296 Repeated falls: Secondary | ICD-10-CM | POA: Diagnosis not present

## 2024-03-14 DIAGNOSIS — R278 Other lack of coordination: Secondary | ICD-10-CM | POA: Diagnosis not present

## 2024-03-14 DIAGNOSIS — M62512 Muscle wasting and atrophy, not elsewhere classified, left shoulder: Secondary | ICD-10-CM | POA: Diagnosis not present

## 2024-03-15 DIAGNOSIS — M62512 Muscle wasting and atrophy, not elsewhere classified, left shoulder: Secondary | ICD-10-CM | POA: Diagnosis not present

## 2024-03-15 DIAGNOSIS — R278 Other lack of coordination: Secondary | ICD-10-CM | POA: Diagnosis not present

## 2024-03-15 DIAGNOSIS — M62511 Muscle wasting and atrophy, not elsewhere classified, right shoulder: Secondary | ICD-10-CM | POA: Diagnosis not present

## 2024-03-15 DIAGNOSIS — R296 Repeated falls: Secondary | ICD-10-CM | POA: Diagnosis not present

## 2024-03-16 DIAGNOSIS — R278 Other lack of coordination: Secondary | ICD-10-CM | POA: Diagnosis not present

## 2024-03-16 DIAGNOSIS — R2681 Unsteadiness on feet: Secondary | ICD-10-CM | POA: Diagnosis not present

## 2024-03-17 DIAGNOSIS — R296 Repeated falls: Secondary | ICD-10-CM | POA: Diagnosis not present

## 2024-03-17 DIAGNOSIS — M62511 Muscle wasting and atrophy, not elsewhere classified, right shoulder: Secondary | ICD-10-CM | POA: Diagnosis not present

## 2024-03-17 DIAGNOSIS — R278 Other lack of coordination: Secondary | ICD-10-CM | POA: Diagnosis not present

## 2024-03-17 DIAGNOSIS — M62512 Muscle wasting and atrophy, not elsewhere classified, left shoulder: Secondary | ICD-10-CM | POA: Diagnosis not present

## 2024-03-18 DIAGNOSIS — G4700 Insomnia, unspecified: Secondary | ICD-10-CM | POA: Diagnosis not present

## 2024-03-18 DIAGNOSIS — F331 Major depressive disorder, recurrent, moderate: Secondary | ICD-10-CM | POA: Diagnosis not present

## 2024-03-18 DIAGNOSIS — F02C0 Dementia in other diseases classified elsewhere, severe, without behavioral disturbance, psychotic disturbance, mood disturbance, and anxiety: Secondary | ICD-10-CM | POA: Diagnosis not present

## 2024-03-18 DIAGNOSIS — G301 Alzheimer's disease with late onset: Secondary | ICD-10-CM | POA: Diagnosis not present

## 2024-03-21 DIAGNOSIS — R2681 Unsteadiness on feet: Secondary | ICD-10-CM | POA: Diagnosis not present

## 2024-03-21 DIAGNOSIS — M62511 Muscle wasting and atrophy, not elsewhere classified, right shoulder: Secondary | ICD-10-CM | POA: Diagnosis not present

## 2024-03-21 DIAGNOSIS — R278 Other lack of coordination: Secondary | ICD-10-CM | POA: Diagnosis not present

## 2024-03-21 DIAGNOSIS — M62512 Muscle wasting and atrophy, not elsewhere classified, left shoulder: Secondary | ICD-10-CM | POA: Diagnosis not present

## 2024-03-21 DIAGNOSIS — R296 Repeated falls: Secondary | ICD-10-CM | POA: Diagnosis not present

## 2024-03-22 DIAGNOSIS — R2681 Unsteadiness on feet: Secondary | ICD-10-CM | POA: Diagnosis not present

## 2024-03-22 DIAGNOSIS — M62511 Muscle wasting and atrophy, not elsewhere classified, right shoulder: Secondary | ICD-10-CM | POA: Diagnosis not present

## 2024-03-22 DIAGNOSIS — R296 Repeated falls: Secondary | ICD-10-CM | POA: Diagnosis not present

## 2024-03-22 DIAGNOSIS — M62512 Muscle wasting and atrophy, not elsewhere classified, left shoulder: Secondary | ICD-10-CM | POA: Diagnosis not present

## 2024-03-22 DIAGNOSIS — R278 Other lack of coordination: Secondary | ICD-10-CM | POA: Diagnosis not present

## 2024-03-23 DIAGNOSIS — Z23 Encounter for immunization: Secondary | ICD-10-CM | POA: Diagnosis not present

## 2024-03-24 DIAGNOSIS — F0390 Unspecified dementia without behavioral disturbance: Secondary | ICD-10-CM | POA: Diagnosis not present

## 2024-03-24 DIAGNOSIS — F411 Generalized anxiety disorder: Secondary | ICD-10-CM | POA: Diagnosis not present

## 2024-03-25 DIAGNOSIS — R2681 Unsteadiness on feet: Secondary | ICD-10-CM | POA: Diagnosis not present

## 2024-03-25 DIAGNOSIS — R278 Other lack of coordination: Secondary | ICD-10-CM | POA: Diagnosis not present

## 2024-03-25 DIAGNOSIS — R296 Repeated falls: Secondary | ICD-10-CM | POA: Diagnosis not present

## 2024-03-25 DIAGNOSIS — M62511 Muscle wasting and atrophy, not elsewhere classified, right shoulder: Secondary | ICD-10-CM | POA: Diagnosis not present

## 2024-03-25 DIAGNOSIS — M62512 Muscle wasting and atrophy, not elsewhere classified, left shoulder: Secondary | ICD-10-CM | POA: Diagnosis not present

## 2024-03-28 DIAGNOSIS — M62512 Muscle wasting and atrophy, not elsewhere classified, left shoulder: Secondary | ICD-10-CM | POA: Diagnosis not present

## 2024-03-28 DIAGNOSIS — M62511 Muscle wasting and atrophy, not elsewhere classified, right shoulder: Secondary | ICD-10-CM | POA: Diagnosis not present

## 2024-03-28 DIAGNOSIS — R278 Other lack of coordination: Secondary | ICD-10-CM | POA: Diagnosis not present

## 2024-03-28 DIAGNOSIS — R296 Repeated falls: Secondary | ICD-10-CM | POA: Diagnosis not present

## 2024-03-29 DIAGNOSIS — R2681 Unsteadiness on feet: Secondary | ICD-10-CM | POA: Diagnosis not present

## 2024-03-29 DIAGNOSIS — R278 Other lack of coordination: Secondary | ICD-10-CM | POA: Diagnosis not present

## 2024-03-29 DIAGNOSIS — N182 Chronic kidney disease, stage 2 (mild): Secondary | ICD-10-CM | POA: Diagnosis not present

## 2024-03-29 DIAGNOSIS — F411 Generalized anxiety disorder: Secondary | ICD-10-CM | POA: Diagnosis not present

## 2024-03-30 DIAGNOSIS — R296 Repeated falls: Secondary | ICD-10-CM | POA: Diagnosis not present

## 2024-03-30 DIAGNOSIS — M62512 Muscle wasting and atrophy, not elsewhere classified, left shoulder: Secondary | ICD-10-CM | POA: Diagnosis not present

## 2024-03-30 DIAGNOSIS — M62511 Muscle wasting and atrophy, not elsewhere classified, right shoulder: Secondary | ICD-10-CM | POA: Diagnosis not present

## 2024-03-30 DIAGNOSIS — R278 Other lack of coordination: Secondary | ICD-10-CM | POA: Diagnosis not present

## 2024-04-01 DIAGNOSIS — R296 Repeated falls: Secondary | ICD-10-CM | POA: Diagnosis not present

## 2024-04-01 DIAGNOSIS — R278 Other lack of coordination: Secondary | ICD-10-CM | POA: Diagnosis not present

## 2024-04-01 DIAGNOSIS — M62512 Muscle wasting and atrophy, not elsewhere classified, left shoulder: Secondary | ICD-10-CM | POA: Diagnosis not present

## 2024-04-01 DIAGNOSIS — M62511 Muscle wasting and atrophy, not elsewhere classified, right shoulder: Secondary | ICD-10-CM | POA: Diagnosis not present

## 2024-04-01 DIAGNOSIS — R2681 Unsteadiness on feet: Secondary | ICD-10-CM | POA: Diagnosis not present

## 2024-04-04 DIAGNOSIS — R278 Other lack of coordination: Secondary | ICD-10-CM | POA: Diagnosis not present

## 2024-04-04 DIAGNOSIS — M62511 Muscle wasting and atrophy, not elsewhere classified, right shoulder: Secondary | ICD-10-CM | POA: Diagnosis not present

## 2024-04-04 DIAGNOSIS — M62512 Muscle wasting and atrophy, not elsewhere classified, left shoulder: Secondary | ICD-10-CM | POA: Diagnosis not present

## 2024-04-04 DIAGNOSIS — R296 Repeated falls: Secondary | ICD-10-CM | POA: Diagnosis not present

## 2024-04-05 DIAGNOSIS — R278 Other lack of coordination: Secondary | ICD-10-CM | POA: Diagnosis not present

## 2024-04-05 DIAGNOSIS — R2681 Unsteadiness on feet: Secondary | ICD-10-CM | POA: Diagnosis not present

## 2024-04-05 DIAGNOSIS — M62512 Muscle wasting and atrophy, not elsewhere classified, left shoulder: Secondary | ICD-10-CM | POA: Diagnosis not present

## 2024-04-05 DIAGNOSIS — M62511 Muscle wasting and atrophy, not elsewhere classified, right shoulder: Secondary | ICD-10-CM | POA: Diagnosis not present

## 2024-04-05 DIAGNOSIS — R296 Repeated falls: Secondary | ICD-10-CM | POA: Diagnosis not present

## 2024-04-06 DIAGNOSIS — R2681 Unsteadiness on feet: Secondary | ICD-10-CM | POA: Diagnosis not present

## 2024-04-06 DIAGNOSIS — R278 Other lack of coordination: Secondary | ICD-10-CM | POA: Diagnosis not present

## 2024-04-07 DIAGNOSIS — M62511 Muscle wasting and atrophy, not elsewhere classified, right shoulder: Secondary | ICD-10-CM | POA: Diagnosis not present

## 2024-04-07 DIAGNOSIS — M62512 Muscle wasting and atrophy, not elsewhere classified, left shoulder: Secondary | ICD-10-CM | POA: Diagnosis not present

## 2024-04-07 DIAGNOSIS — R278 Other lack of coordination: Secondary | ICD-10-CM | POA: Diagnosis not present

## 2024-04-07 DIAGNOSIS — R296 Repeated falls: Secondary | ICD-10-CM | POA: Diagnosis not present

## 2024-04-08 DIAGNOSIS — R2681 Unsteadiness on feet: Secondary | ICD-10-CM | POA: Diagnosis not present

## 2024-04-08 DIAGNOSIS — R278 Other lack of coordination: Secondary | ICD-10-CM | POA: Diagnosis not present

## 2024-04-11 DIAGNOSIS — R296 Repeated falls: Secondary | ICD-10-CM | POA: Diagnosis not present

## 2024-04-11 DIAGNOSIS — M62511 Muscle wasting and atrophy, not elsewhere classified, right shoulder: Secondary | ICD-10-CM | POA: Diagnosis not present

## 2024-04-11 DIAGNOSIS — M62512 Muscle wasting and atrophy, not elsewhere classified, left shoulder: Secondary | ICD-10-CM | POA: Diagnosis not present

## 2024-04-11 DIAGNOSIS — R278 Other lack of coordination: Secondary | ICD-10-CM | POA: Diagnosis not present

## 2024-04-11 DIAGNOSIS — R2681 Unsteadiness on feet: Secondary | ICD-10-CM | POA: Diagnosis not present

## 2024-04-13 DIAGNOSIS — R278 Other lack of coordination: Secondary | ICD-10-CM | POA: Diagnosis not present

## 2024-04-13 DIAGNOSIS — R296 Repeated falls: Secondary | ICD-10-CM | POA: Diagnosis not present

## 2024-04-13 DIAGNOSIS — M62512 Muscle wasting and atrophy, not elsewhere classified, left shoulder: Secondary | ICD-10-CM | POA: Diagnosis not present

## 2024-04-13 DIAGNOSIS — R2681 Unsteadiness on feet: Secondary | ICD-10-CM | POA: Diagnosis not present

## 2024-04-13 DIAGNOSIS — M62511 Muscle wasting and atrophy, not elsewhere classified, right shoulder: Secondary | ICD-10-CM | POA: Diagnosis not present

## 2024-04-14 DIAGNOSIS — M62511 Muscle wasting and atrophy, not elsewhere classified, right shoulder: Secondary | ICD-10-CM | POA: Diagnosis not present

## 2024-04-14 DIAGNOSIS — R296 Repeated falls: Secondary | ICD-10-CM | POA: Diagnosis not present

## 2024-04-14 DIAGNOSIS — R278 Other lack of coordination: Secondary | ICD-10-CM | POA: Diagnosis not present

## 2024-04-14 DIAGNOSIS — M62512 Muscle wasting and atrophy, not elsewhere classified, left shoulder: Secondary | ICD-10-CM | POA: Diagnosis not present

## 2024-04-15 DIAGNOSIS — G301 Alzheimer's disease with late onset: Secondary | ICD-10-CM | POA: Diagnosis not present

## 2024-04-15 DIAGNOSIS — F02C0 Dementia in other diseases classified elsewhere, severe, without behavioral disturbance, psychotic disturbance, mood disturbance, and anxiety: Secondary | ICD-10-CM | POA: Diagnosis not present

## 2024-04-15 DIAGNOSIS — G4700 Insomnia, unspecified: Secondary | ICD-10-CM | POA: Diagnosis not present

## 2024-04-15 DIAGNOSIS — R2681 Unsteadiness on feet: Secondary | ICD-10-CM | POA: Diagnosis not present

## 2024-04-15 DIAGNOSIS — F331 Major depressive disorder, recurrent, moderate: Secondary | ICD-10-CM | POA: Diagnosis not present

## 2024-04-15 DIAGNOSIS — R278 Other lack of coordination: Secondary | ICD-10-CM | POA: Diagnosis not present

## 2024-04-18 DIAGNOSIS — R296 Repeated falls: Secondary | ICD-10-CM | POA: Diagnosis not present

## 2024-04-18 DIAGNOSIS — R2681 Unsteadiness on feet: Secondary | ICD-10-CM | POA: Diagnosis not present

## 2024-04-18 DIAGNOSIS — R278 Other lack of coordination: Secondary | ICD-10-CM | POA: Diagnosis not present

## 2024-04-18 DIAGNOSIS — M62512 Muscle wasting and atrophy, not elsewhere classified, left shoulder: Secondary | ICD-10-CM | POA: Diagnosis not present

## 2024-04-18 DIAGNOSIS — M62511 Muscle wasting and atrophy, not elsewhere classified, right shoulder: Secondary | ICD-10-CM | POA: Diagnosis not present

## 2024-04-19 DIAGNOSIS — M62511 Muscle wasting and atrophy, not elsewhere classified, right shoulder: Secondary | ICD-10-CM | POA: Diagnosis not present

## 2024-04-19 DIAGNOSIS — M62512 Muscle wasting and atrophy, not elsewhere classified, left shoulder: Secondary | ICD-10-CM | POA: Diagnosis not present

## 2024-04-19 DIAGNOSIS — R278 Other lack of coordination: Secondary | ICD-10-CM | POA: Diagnosis not present

## 2024-04-19 DIAGNOSIS — R296 Repeated falls: Secondary | ICD-10-CM | POA: Diagnosis not present

## 2024-04-20 DIAGNOSIS — R278 Other lack of coordination: Secondary | ICD-10-CM | POA: Diagnosis not present

## 2024-04-20 DIAGNOSIS — R2681 Unsteadiness on feet: Secondary | ICD-10-CM | POA: Diagnosis not present

## 2024-04-21 DIAGNOSIS — M62511 Muscle wasting and atrophy, not elsewhere classified, right shoulder: Secondary | ICD-10-CM | POA: Diagnosis not present

## 2024-04-21 DIAGNOSIS — B351 Tinea unguium: Secondary | ICD-10-CM | POA: Diagnosis not present

## 2024-04-21 DIAGNOSIS — R296 Repeated falls: Secondary | ICD-10-CM | POA: Diagnosis not present

## 2024-04-21 DIAGNOSIS — M62512 Muscle wasting and atrophy, not elsewhere classified, left shoulder: Secondary | ICD-10-CM | POA: Diagnosis not present

## 2024-04-21 DIAGNOSIS — R278 Other lack of coordination: Secondary | ICD-10-CM | POA: Diagnosis not present

## 2024-04-22 DIAGNOSIS — R278 Other lack of coordination: Secondary | ICD-10-CM | POA: Diagnosis not present

## 2024-04-22 DIAGNOSIS — R2681 Unsteadiness on feet: Secondary | ICD-10-CM | POA: Diagnosis not present

## 2024-04-24 DIAGNOSIS — R296 Repeated falls: Secondary | ICD-10-CM | POA: Diagnosis not present

## 2024-04-24 DIAGNOSIS — M62512 Muscle wasting and atrophy, not elsewhere classified, left shoulder: Secondary | ICD-10-CM | POA: Diagnosis not present

## 2024-04-24 DIAGNOSIS — R2681 Unsteadiness on feet: Secondary | ICD-10-CM | POA: Diagnosis not present

## 2024-04-24 DIAGNOSIS — R278 Other lack of coordination: Secondary | ICD-10-CM | POA: Diagnosis not present

## 2024-04-24 DIAGNOSIS — M62511 Muscle wasting and atrophy, not elsewhere classified, right shoulder: Secondary | ICD-10-CM | POA: Diagnosis not present

## 2024-04-25 DIAGNOSIS — R296 Repeated falls: Secondary | ICD-10-CM | POA: Diagnosis not present

## 2024-04-25 DIAGNOSIS — M62512 Muscle wasting and atrophy, not elsewhere classified, left shoulder: Secondary | ICD-10-CM | POA: Diagnosis not present

## 2024-04-25 DIAGNOSIS — R278 Other lack of coordination: Secondary | ICD-10-CM | POA: Diagnosis not present

## 2024-04-25 DIAGNOSIS — M62511 Muscle wasting and atrophy, not elsewhere classified, right shoulder: Secondary | ICD-10-CM | POA: Diagnosis not present

## 2024-04-25 DIAGNOSIS — R2681 Unsteadiness on feet: Secondary | ICD-10-CM | POA: Diagnosis not present

## 2024-04-26 DIAGNOSIS — F02C0 Dementia in other diseases classified elsewhere, severe, without behavioral disturbance, psychotic disturbance, mood disturbance, and anxiety: Secondary | ICD-10-CM | POA: Diagnosis not present

## 2024-04-26 DIAGNOSIS — M6281 Muscle weakness (generalized): Secondary | ICD-10-CM | POA: Diagnosis not present

## 2024-04-26 DIAGNOSIS — R2681 Unsteadiness on feet: Secondary | ICD-10-CM | POA: Diagnosis not present

## 2024-04-26 DIAGNOSIS — I10 Essential (primary) hypertension: Secondary | ICD-10-CM | POA: Diagnosis not present

## 2024-04-26 DIAGNOSIS — Z993 Dependence on wheelchair: Secondary | ICD-10-CM | POA: Diagnosis not present

## 2024-04-26 DIAGNOSIS — G4700 Insomnia, unspecified: Secondary | ICD-10-CM | POA: Diagnosis not present

## 2024-04-26 DIAGNOSIS — G301 Alzheimer's disease with late onset: Secondary | ICD-10-CM | POA: Diagnosis not present

## 2024-04-29 DIAGNOSIS — R278 Other lack of coordination: Secondary | ICD-10-CM | POA: Diagnosis not present

## 2024-04-29 DIAGNOSIS — R2681 Unsteadiness on feet: Secondary | ICD-10-CM | POA: Diagnosis not present

## 2024-04-30 DIAGNOSIS — R2681 Unsteadiness on feet: Secondary | ICD-10-CM | POA: Diagnosis not present

## 2024-04-30 DIAGNOSIS — R278 Other lack of coordination: Secondary | ICD-10-CM | POA: Diagnosis not present

## 2024-05-13 DIAGNOSIS — F331 Major depressive disorder, recurrent, moderate: Secondary | ICD-10-CM | POA: Diagnosis not present

## 2024-05-13 DIAGNOSIS — G4700 Insomnia, unspecified: Secondary | ICD-10-CM | POA: Diagnosis not present

## 2024-05-13 DIAGNOSIS — G301 Alzheimer's disease with late onset: Secondary | ICD-10-CM | POA: Diagnosis not present

## 2024-05-13 DIAGNOSIS — F02C0 Dementia in other diseases classified elsewhere, severe, without behavioral disturbance, psychotic disturbance, mood disturbance, and anxiety: Secondary | ICD-10-CM | POA: Diagnosis not present
